# Patient Record
Sex: Female | Born: 1980 | Race: White | Hispanic: No | Marital: Married | Smoking: Never smoker
Health system: Southern US, Community
[De-identification: ages and names within clinical notes are randomized; demographics above are authoritative.]

## PROBLEM LIST (undated history)

## (undated) ENCOUNTER — Inpatient Hospital Stay (HOSPITAL_COMMUNITY): Payer: Self-pay

## (undated) DIAGNOSIS — E282 Polycystic ovarian syndrome: Secondary | ICD-10-CM

## (undated) DIAGNOSIS — N979 Female infertility, unspecified: Secondary | ICD-10-CM

## (undated) DIAGNOSIS — E559 Vitamin D deficiency, unspecified: Secondary | ICD-10-CM

## (undated) DIAGNOSIS — Z789 Other specified health status: Secondary | ICD-10-CM

## (undated) DIAGNOSIS — I10 Essential (primary) hypertension: Secondary | ICD-10-CM

## (undated) HISTORY — DX: Essential (primary) hypertension: I10

## (undated) HISTORY — DX: Vitamin D deficiency, unspecified: E55.9

## (undated) HISTORY — DX: Female infertility, unspecified: N97.9

## (undated) HISTORY — DX: Other specified health status: Z78.9

## (undated) HISTORY — PX: WISDOM TOOTH EXTRACTION: SHX21

## (undated) HISTORY — DX: Polycystic ovarian syndrome: E28.2

---

## 2012-02-04 ENCOUNTER — Emergency Department (INDEPENDENT_AMBULATORY_CARE_PROVIDER_SITE_OTHER)
Admission: EM | Admit: 2012-02-04 | Discharge: 2012-02-04 | Disposition: A | Discharge status: Home / Self Care | Payer: Commercial Managed Care - PPO | Source: Home / Self Care | Attending: Emergency Medicine | Admitting: Emergency Medicine

## 2012-02-04 ENCOUNTER — Encounter: Payer: Self-pay | Admitting: *Deleted

## 2012-02-04 DIAGNOSIS — R300 Dysuria: Secondary | ICD-10-CM

## 2012-02-04 DIAGNOSIS — N390 Urinary tract infection, site not specified: Secondary | ICD-10-CM

## 2012-02-04 LAB — POCT URINALYSIS DIP (MANUAL ENTRY)
Protein Ur, POC: 30
Spec Grav, UA: >=1.03 (ref 1.005–1.03)
Urobilinogen, UA: 1 (ref 0–1)

## 2012-02-04 MED ORDER — CIPROFLOXACIN HCL 500 MG PO TABS: 500.0000 mg | ORAL_TABLET | Freq: Two times a day (BID) | ORAL | Status: DC

## 2012-02-04 NOTE — ED Provider Notes (Signed)
History     CSN: [id number]  Arrival date & time 02/04/12  1816   First MD Initiated Contact with Patient 02/04/12 1822      Chief Complaint  Patient presents with  . Dysuria  . Urinary Frequency    (Consider location/radiation/quality/duration/timing/severity/associated sxs/prior treatment) HPI Adrienne Bishop is a 32 y.o. female who presents today with UTI symptoms for 3 weeks.  Sexually active with her boyfriend, no history of STD. + dysuria with odor in the mornings + frequency + urgency No hematuria No vaginal discharge No fever/chills No lower abdominal pain No back pain No fatigue    History reviewed. No pertinent past medical history.  Past Surgical History  Procedure Date  . Wisdom tooth extraction     History reviewed. No pertinent family history.  History  Substance Use Topics  . Smoking status: Never Smoker   . Smokeless tobacco: Not on file  . Alcohol Use: No    OB History    Grav Para Term Preterm Abortions TAB SAB Ect Mult Living                  Review of Systems  All other systems reviewed and are negative.    Allergies  Review of patient's allergies indicates no known allergies.  Home Medications   Current Outpatient Rx  Name  Route  Sig  Dispense  Refill  . DESOGESTREL-ETHINYL ESTRADIOL 0.15-0.02/0.01 MG (21/5) PO TABS   Oral   Take 1 tablet by mouth daily.           BP 144/90  Pulse 78  Temp 98.4 F (36.9 C) (Oral)  Resp 18  Ht 5\' 3"  (1.6 m)  Wt 235 lb (106.595 kg)  BMI 41.63 kg/m2  SpO2 100%  LMP 01/23/2012  Physical Exam  Nursing note and vitals reviewed. Constitutional: She is oriented to person, place, and time. She appears well-developed and well-nourished.  HENT:  Head: Normocephalic and atraumatic.  Eyes: No scleral icterus.  Neck: Neck supple.  Cardiovascular: Regular rhythm and normal heart sounds.   Pulmonary/Chest: Effort normal and breath sounds normal. No respiratory distress.  Abdominal: Soft. Normal  appearance and bowel sounds are normal. She exhibits no mass. There is no rebound, no guarding and no CVA tenderness.  Neurological: She is alert and oriented to person, place, and time.  Skin: Skin is warm and dry.  Psychiatric: She has a normal mood and affect. Her speech is normal.    ED Course  Procedures (including critical care time)   Labs Reviewed  POCT URINALYSIS DIP (MANUAL ENTRY)  URINE CULTURE   No results found.   1. Urinary tract infection, site not specified   2. Dysuria    Results for orders placed during the hospital encounter of 02/04/12  POCT URINALYSIS DIP (MANUAL ENTRY)      Component Value Range   Color, UA yellow     Clarity, UA cloudy     Glucose, UA neg     Bilirubin, UA negative     Bilirubin, UA negative     Spec Grav, UA >=1.030  1.005 - 1.03   Blood, UA small     pH, UA 6.0  5 - 8   Protein Ur, POC =30     Urobilinogen, UA 1.0  0 - 1   Nitrite, UA Negative     Leukocytes, UA Trace         MDM  1) Take the prescribed antibiotic as directed. 2) A urinalysis was  done in clinic.  A urine culture is pending. 3) Follow up with your PCP or urologist if not improving or if worsening symptoms.  If not improving after Cipro, would suggest f/u with her gynecologist to check on other etiologies such as BV, yeast, etc.   Marlaine Hind, MD 02/04/12 1840

## 2012-02-04 NOTE — ED Notes (Signed)
Pt c/o urinary urgency, foul smelling urine and dysuria x 3wks. Denies fever. No OTC meds.

## 2012-02-09 ENCOUNTER — Telehealth: Payer: Self-pay | Admitting: *Deleted

## 2015-03-03 ENCOUNTER — Emergency Department (INDEPENDENT_AMBULATORY_CARE_PROVIDER_SITE_OTHER): Payer: Self-pay

## 2015-03-03 ENCOUNTER — Emergency Department (INDEPENDENT_AMBULATORY_CARE_PROVIDER_SITE_OTHER)
Admission: EM | Admit: 2015-03-03 | Discharge: 2015-03-03 | Disposition: A | Discharge status: Home / Self Care | Payer: Self-pay | Source: Home / Self Care | Attending: Emergency Medicine | Admitting: Emergency Medicine

## 2015-03-03 ENCOUNTER — Encounter (HOSPITAL_COMMUNITY): Payer: Self-pay | Admitting: Emergency Medicine

## 2015-03-03 DIAGNOSIS — J189 Pneumonia, unspecified organism: Secondary | ICD-10-CM

## 2015-03-03 MED ORDER — LIDOCAINE HCL (PF) 1 % IJ SOLN
INTRAMUSCULAR | Status: AC
  Filled 2015-03-03: qty 5

## 2015-03-03 MED ORDER — AZITHROMYCIN 250 MG PO TABS: 250.0000 mg | ORAL_TABLET | Freq: Every day | ORAL | Status: DC

## 2015-03-03 MED ORDER — CEFTRIAXONE SODIUM 1 G IJ SOLR
INTRAMUSCULAR | Status: AC
  Filled 2015-03-03: qty 10

## 2015-03-03 MED ORDER — CEFTRIAXONE SODIUM 1 G IJ SOLR
1.0000 g | Freq: Once | INTRAMUSCULAR | Status: AC
  Administered 2015-03-03: 1 g via INTRAMUSCULAR

## 2015-03-03 MED ORDER — AZITHROMYCIN 250 MG PO TABS
ORAL_TABLET | ORAL | Status: AC
  Filled 2015-03-03: qty 2

## 2015-03-03 MED ORDER — AZITHROMYCIN 250 MG PO TABS
500.0000 mg | ORAL_TABLET | Freq: Once | ORAL | Status: AC
  Administered 2015-03-03: 500 mg via ORAL

## 2015-03-03 NOTE — ED Notes (Signed)
Patient placed on 20 minute observation post IM injection.

## 2015-03-03 NOTE — ED Provider Notes (Signed)
CSN: [id number]     Arrival date & time 03/03/15  1639 History   First MD Initiated Contact with Patient 03/03/15 1721     Chief Complaint  Patient presents with  . Fever  . Cough  . Nausea   (Consider location/radiation/quality/duration/timing/severity/associated sxs/prior Treatment) Patient is a 35 y.o. female presenting with fever and cough. The history is provided by the patient. No language interpreter was used.  Fever Max temp prior to arrival:  102 Temp source:  Subjective and oral Severity:  Moderate Onset quality:  Gradual Duration:  3 days Timing:  Constant Progression:  Worsening Chronicity:  New Relieved by:  Nothing Worsened by:  Nothing tried Ineffective treatments:  None tried Associated symptoms: cough   Risk factors: no sick contacts   Cough Associated symptoms: fever   Pt complains of a cough and fever  History reviewed. No pertinent past medical history. Past Surgical History  Procedure Laterality Date  . Wisdom tooth extraction     History reviewed. No pertinent family history. Social History  Substance Use Topics  . Smoking status: Never Smoker   . Smokeless tobacco: None  . Alcohol Use: No   OB History    No data available     Review of Systems  Constitutional: Positive for fever.  Respiratory: Positive for cough.   All other systems reviewed and are negative.   Allergies  Review of patient's allergies indicates no known allergies.  Home Medications   Prior to Admission medications   Medication Sig Start Date End Date Taking? Authorizing Provider  ciprofloxacin (CIPRO) 500 MG tablet Take 1 tablet (500 mg total) by mouth 2 (two) times daily. 02/04/12   Marlaine Hind, MD  desogestrel-ethinyl estradiol (AZURETTE) 0.15-0.02/0.01 MG (21/5) tablet Take 1 tablet by mouth daily.    Historical Provider, MD   Meds Ordered and Administered this Visit  Medications - No data to display  BP 159/85 mmHg  Pulse 111  Temp(Src) 102.6 F (39.2  C) (Oral)  SpO2 98%  LMP 03/03/2015 (Exact Date) No data found.   Physical Exam  Constitutional: She is oriented to person, place, and time. She appears well-developed and well-nourished.  HENT:  Head: Normocephalic and atraumatic.  Eyes: EOM are normal. Pupils are equal, round, and reactive to light.  Neck: Normal range of motion.  Cardiovascular: Normal rate and normal heart sounds.   Pulmonary/Chest: Effort normal.  Rhonchi left lung  Abdominal: She exhibits no distension.  Musculoskeletal: Normal range of motion.  Neurological: She is alert and oriented to person, place, and time.  Skin: Skin is warm.  Psychiatric: She has a normal mood and affect.  Nursing note and vitals reviewed.   ED Course  Procedures (including critical care time)  Labs Review Labs Reviewed - No data to display  Imaging Review Dg Chest 2 View  03/03/2015  CLINICAL DATA:  Two day history of fever, cough and nausea. EXAM: CHEST  2 VIEW COMPARISON:  None. FINDINGS: Cardiomediastinal silhouette unremarkable. Airspace consolidation in the superior segment left lower lobe. Lungs otherwise clear. No pleural effusions. Visualized bony thorax intact. IMPRESSION: Acute pneumonia involving the superior segment left lower lobe. Electronically Signed   By: Hulan Saas M.D.   On: 03/03/2015 18:32     Visual Acuity Review  Right Eye Distance:   Left Eye Distance:   Bilateral Distance:    Right Eye Near:   Left Eye Near:    Bilateral Near:         MDM  Pt counseled on pneumonia. Pt advised to see her MD for recheck in 2-3 days.  Go to ED if symptoms worsen or cahnge    1. Community acquired pneumonia    Meds ordered this encounter  Medications  . cefTRIAXone (ROCEPHIN) injection 1 g    Sig:     Order Specific Question:  Antibiotic Indication:    Answer:  CAP  . azithromycin (ZITHROMAX) tablet 500 mg    Sig:   . DISCONTD: azithromycin (ZITHROMAX) 250 MG tablet    Sig: Take 1 tablet (250  mg total) by mouth daily. Take first 2 tablets together, then 1 every day until finished.    Dispense:  6 tablet    Refill:  0    Order Specific Question:  Supervising Provider    Answer:  Charm Rings [id number]  . azithromycin (ZITHROMAX) 250 MG tablet    Sig: Take 1 tablet (250 mg total) by mouth daily. Take first 2 tablets together, then 1 every day until finished.    Dispense:  6 tablet    Refill:  0    Order Specific Question:  Supervising Provider    Answer:  Charm Rings [phone]  An After Visit Summary was printed and given to the patient.    Lonia Skinner [place], PA-C 03/03/15 1930

## 2015-03-03 NOTE — ED Notes (Signed)
Patient completed 20 minute observation with no signs or complaints of reaction.

## 2015-03-03 NOTE — Discharge Instructions (Signed)

## 2015-03-03 NOTE — ED Notes (Signed)
The patient presented to the [facility] with a complaint of a fever, cough and nausea x 2 days.

## 2015-03-12 ENCOUNTER — Encounter: Payer: Self-pay | Admitting: *Deleted

## 2015-03-12 ENCOUNTER — Emergency Department
Admission: EM | Admit: 2015-03-12 | Discharge: 2015-03-12 | Disposition: A | Discharge status: Home / Self Care | Payer: Self-pay | Source: Home / Self Care | Attending: Family Medicine | Admitting: Family Medicine

## 2015-03-12 DIAGNOSIS — R05 Cough: Secondary | ICD-10-CM

## 2015-03-12 DIAGNOSIS — R0989 Other specified symptoms and signs involving the circulatory and respiratory systems: Secondary | ICD-10-CM

## 2015-03-12 DIAGNOSIS — R059 Cough, unspecified: Secondary | ICD-10-CM

## 2015-03-12 DIAGNOSIS — J189 Pneumonia, unspecified organism: Secondary | ICD-10-CM

## 2015-03-12 MED ORDER — ALBUTEROL SULFATE HFA 108 (90 BASE) MCG/ACT IN AERS: 1.0000 | INHALATION_SPRAY | Freq: Four times a day (QID) | RESPIRATORY_TRACT | Status: DC | PRN

## 2015-03-12 MED ORDER — BENZONATATE 100 MG PO CAPS: 100.0000 mg | ORAL_CAPSULE | Freq: Three times a day (TID) | ORAL | Status: DC

## 2015-03-12 MED ORDER — DM-GUAIFENESIN ER 30-600 MG PO TB12: 1.0000 | ORAL_TABLET | Freq: Two times a day (BID) | ORAL | Status: DC

## 2015-03-12 MED ORDER — PREDNISONE 20 MG PO TABS: ORAL_TABLET | ORAL | Status: DC

## 2015-03-12 NOTE — ED Notes (Signed)
Pt reports that she still has some SOB, a nonproductive cough and fatigue after her dx of pneumonia on 03/03/15. Denies fever.

## 2015-03-12 NOTE — ED Provider Notes (Signed)
CSN: [id number]     Arrival date & time 03/12/15  1645 History   First MD Initiated Contact with Patient 03/12/15 1654     Chief Complaint  Patient presents with  . Cough   (Consider location/radiation/quality/duration/timing/severity/associated sxs/prior Treatment) HPI Pt is a 34yo female presenting to [facility] with c/o continued cough, mild intermittent shortness of breath and fatigue that started initially about 10 days ago, dx with CAP on 03/03/15.  She was given IM rocephin once and discharged home with azithromycin.  She completed her antibiotic about 4 days ago but states she still has a mild to moderately intermittent productive cough. Pt states it feels like there is congestion stuck in her upper chest and throat that she cannot get up.  She has been taking Nyquil to help her sleep but no cough medication during the day. Pt notes the fever, nausea and vomiting resolved once she completed the antibiotics. Denies hx of asthma. Denies prior need for inhaler when sick. No recent travel.   History reviewed. No pertinent past medical history. Past Surgical History  Procedure Laterality Date  . Wisdom tooth extraction     History reviewed. No pertinent family history. Social History  Substance Use Topics  . Smoking status: Never Smoker   . Smokeless tobacco: None  . Alcohol Use: No   OB History    No data available     Review of Systems  Constitutional: Negative for fever and chills.  HENT: Positive for congestion, rhinorrhea, sneezing, sore throat and voice change. Negative for ear pain and trouble swallowing.   Respiratory: Positive for cough, shortness of breath and wheezing.   Cardiovascular: Negative for chest pain and palpitations.  Gastrointestinal: Negative for nausea, vomiting, abdominal pain and diarrhea.  Musculoskeletal: Positive for myalgias and arthralgias. Negative for back pain.  Skin: Negative for rash.  All other systems reviewed and are negative.   Allergies   Review of patient's allergies indicates no known allergies.  Home Medications   Prior to Admission medications   Medication Sig Start Date End Date Taking? Authorizing Provider  albuterol (PROVENTIL HFA;VENTOLIN HFA) 108 (90 Base) MCG/ACT inhaler Inhale 1-2 puffs into the lungs every 6 (six) hours as needed for wheezing or shortness of breath. 03/12/15   Junius Finner, PA-C  benzonatate (TESSALON) 100 MG capsule Take 1 capsule (100 mg total) by mouth every 8 (eight) hours. 03/12/15   Junius Finner, PA-C  desogestrel-ethinyl estradiol (AZURETTE) 0.15-0.02/0.01 MG (21/5) tablet Take 1 tablet by mouth daily.    Historical Provider, MD  dextromethorphan-guaiFENesin (MUCINEX DM) 30-600 MG 12hr tablet Take 1 tablet by mouth 2 (two) times daily. Take with large glass of water 03/12/15   Junius Finner, PA-C  predniSONE (DELTASONE) 20 MG tablet 3 tabs po day one, then 2 po daily x 4 days 03/12/15   Junius Finner, PA-C   Meds Ordered and Administered this Visit  Medications - No data to display  BP 142/87 mmHg  Pulse 87  Temp(Src) 98.1 F (36.7 C) (Oral)  Resp 18  Ht  (1.6 m)  Wt 263 lb (119.296 kg)  BMI 46.60 kg/m2  SpO2 99%  LMP 03/03/2015 (Exact Date) No data found.   Physical Exam  Constitutional: She appears well-developed and well-nourished. No distress.  HENT:  Head: Normocephalic and atraumatic.  Right Ear: Hearing, tympanic membrane, external ear and ear canal normal.  Left Ear: Hearing, tympanic membrane, external ear and ear canal normal.  Nose: Mucosal edema and rhinorrhea present. Right sinus exhibits  no maxillary sinus tenderness and no frontal sinus tenderness. Left sinus exhibits no maxillary sinus tenderness and no frontal sinus tenderness.  Mouth/Throat: Uvula is midline, oropharynx is clear and moist and mucous membranes are normal.  Eyes: Conjunctivae are normal. No scleral icterus.  Neck: Normal range of motion. Neck supple.  Cardiovascular: Normal rate, regular rhythm  and normal heart sounds.   Pulmonary/Chest: Effort normal and breath sounds normal. No stridor. No respiratory distress. She has no wheezes. She has no rales. She exhibits no tenderness.  Abdominal: Soft. She exhibits no distension. There is no tenderness.  Musculoskeletal: Normal range of motion.  Lymphadenopathy:    She has no cervical adenopathy.  Neurological: She is alert.  Skin: Skin is warm and dry. She is not diaphoretic.  Nursing note and vitals reviewed.   ED Course  Procedures (including critical care time)  Labs Review Labs Reviewed - No data to display  Imaging Review No results found.    MDM   1. CAP (community acquired pneumonia)   2. Cough   3. Chest congestion    Pt c/o continued cough and fatigue after recent dx of CAP. Pt appears well, non-toxic. Lungs: CTAB.  O2 Sat 99% on RA  Discussed getting a repeat CXR to ensure pneumonia is not worsening, however, with no fever or vomiting and some improvement shown after antibiotic treatment, low concern for worsening of illness at this time.  Pt agreed to to hold off on CXR and try more symptomatic treatment.  Rx: Albuterol inhaler, prednisone, tessalon, and Mucinex.  Advised pt to use acetaminophen and ibuprofen as needed for fever and pain. Encouraged rest and fluids. F/u with PCP in 5-7 days if not improving, sooner if worsening. Pt verbalized understanding and agreement with tx plan.    Junius Finner, PA-C 03/12/15 [phone]

## 2015-03-28 ENCOUNTER — Other Ambulatory Visit: Payer: Self-pay | Admitting: Physician Assistant

## 2015-03-28 ENCOUNTER — Ambulatory Visit
Admission: RE | Admit: 2015-03-28 | Discharge: 2015-03-28 | Disposition: A | Discharge status: Home / Self Care | Payer: Self-pay | Source: Ambulatory Visit | Attending: Physician Assistant | Admitting: Physician Assistant

## 2015-03-28 DIAGNOSIS — Z8701 Personal history of pneumonia (recurrent): Secondary | ICD-10-CM

## 2015-12-19 ENCOUNTER — Encounter: Payer: Self-pay | Admitting: Podiatry

## 2015-12-19 ENCOUNTER — Ambulatory Visit (INDEPENDENT_AMBULATORY_CARE_PROVIDER_SITE_OTHER): Payer: 59 | Admitting: Podiatry

## 2015-12-19 VITALS — BP 124/89 | HR 89

## 2015-12-19 DIAGNOSIS — L600 Ingrowing nail: Secondary | ICD-10-CM

## 2015-12-19 NOTE — Progress Notes (Signed)
   Subjective:    Patient ID: Tresa EndoKelly Tal, female    DOB: 03/05/1980, [age] y.o.   MRN: [id number]  HPI    Review of Systems  Allergic/Immunologic: Positive for food allergies.  All other systems reviewed and are negative.      Objective:   Physical Exam        Assessment & Plan:

## 2015-12-19 NOTE — Progress Notes (Signed)
Subjective:     Patient ID: Adrienne Bishop, female   DOB: 06/27/1980, 35 y.o.   MRN: [id number]  HPI patient states she has approximate 6 month history of painful ingrown toenail left big toe. She had some drainage earlier and that has resolved but she continues to develop ingrown toenail with pain on the medial side of the big toe   Review of Systems  All other systems reviewed and are negative.      Objective:   Physical Exam  Constitutional: She is oriented to person, place, and time.  Cardiovascular: Intact distal pulses.   Musculoskeletal: Normal range of motion.  Neurological: She is oriented to person, place, and time.  Skin: Skin is warm.  Nursing note and vitals reviewed.  neurovascular status intact muscle strength adequate range of motion within normal limits with patient found to have incurvated left hallux medial border with pain when palpated and slight distal redness with no active drainage. Found to have good digital perfusion and is well oriented 3     Assessment:     Ingrown toenail deformity left hallux medial border with pain    Plan:     H&P condition reviewed and discussed permanent procedure with risk of procedure and risk of nonhealing. Patient wants surgery understanding risk and today I infiltrated the left hallux 60 Milligan times I can Marcaine mixture removed the medial border exposed matrix and applied phenol 3 applications 30 seconds followed by alcohol lavage and sterile dressing. Gave instructions on soaks and reappoint

## 2015-12-19 NOTE — Patient Instructions (Signed)

## 2016-04-17 DIAGNOSIS — Z0000 Encounter for general adult medical examination without abnormal findings: Secondary | ICD-10-CM | POA: Diagnosis not present

## 2016-04-17 DIAGNOSIS — Z713 Dietary counseling and surveillance: Secondary | ICD-10-CM | POA: Diagnosis not present

## 2016-06-16 DIAGNOSIS — E559 Vitamin D deficiency, unspecified: Secondary | ICD-10-CM | POA: Diagnosis not present

## 2016-08-20 DIAGNOSIS — N926 Irregular menstruation, unspecified: Secondary | ICD-10-CM | POA: Diagnosis not present

## 2016-09-30 DIAGNOSIS — Z01419 Encounter for gynecological examination (general) (routine) without abnormal findings: Secondary | ICD-10-CM | POA: Diagnosis not present

## 2016-11-30 DIAGNOSIS — J3089 Other allergic rhinitis: Secondary | ICD-10-CM | POA: Diagnosis not present

## 2016-11-30 DIAGNOSIS — J301 Allergic rhinitis due to pollen: Secondary | ICD-10-CM | POA: Diagnosis not present

## 2017-01-18 DIAGNOSIS — S29019A Strain of muscle and tendon of unspecified wall of thorax, initial encounter: Secondary | ICD-10-CM | POA: Diagnosis not present

## 2017-02-23 DIAGNOSIS — R05 Cough: Secondary | ICD-10-CM | POA: Diagnosis not present

## 2017-07-06 DIAGNOSIS — E288 Other ovarian dysfunction: Secondary | ICD-10-CM | POA: Diagnosis not present

## 2017-07-06 DIAGNOSIS — Z319 Encounter for procreative management, unspecified: Secondary | ICD-10-CM | POA: Diagnosis not present

## 2017-07-06 DIAGNOSIS — E282 Polycystic ovarian syndrome: Secondary | ICD-10-CM | POA: Diagnosis not present

## 2017-09-01 DIAGNOSIS — E288 Other ovarian dysfunction: Secondary | ICD-10-CM | POA: Diagnosis not present

## 2017-09-01 DIAGNOSIS — Z319 Encounter for procreative management, unspecified: Secondary | ICD-10-CM | POA: Diagnosis not present

## 2017-09-17 DIAGNOSIS — Z319 Encounter for procreative management, unspecified: Secondary | ICD-10-CM | POA: Diagnosis not present

## 2017-09-17 DIAGNOSIS — E288 Other ovarian dysfunction: Secondary | ICD-10-CM | POA: Diagnosis not present

## 2017-10-25 DIAGNOSIS — Z3200 Encounter for pregnancy test, result unknown: Secondary | ICD-10-CM | POA: Diagnosis not present

## 2017-10-27 DIAGNOSIS — Z3201 Encounter for pregnancy test, result positive: Secondary | ICD-10-CM | POA: Diagnosis not present

## 2017-11-10 DIAGNOSIS — Z3200 Encounter for pregnancy test, result unknown: Secondary | ICD-10-CM | POA: Diagnosis not present

## 2017-11-17 DIAGNOSIS — O200 Threatened abortion: Secondary | ICD-10-CM | POA: Diagnosis not present

## 2017-11-24 DIAGNOSIS — O0900 Supervision of pregnancy with history of infertility, unspecified trimester: Secondary | ICD-10-CM | POA: Diagnosis not present

## 2017-12-15 LAB — OB RESULTS CONSOLE ANTIBODY SCREEN: Antibody Screen: NEGATIVE

## 2017-12-15 LAB — OB RESULTS CONSOLE HIV ANTIBODY (ROUTINE TESTING): HIV: NONREACTIVE

## 2017-12-15 LAB — OB RESULTS CONSOLE ABO/RH: RH Type: POSITIVE

## 2017-12-15 LAB — OB RESULTS CONSOLE GC/CHLAMYDIA
Chlamydia: NEGATIVE
Gonorrhea: NEGATIVE

## 2017-12-15 LAB — OB RESULTS CONSOLE RPR: RPR: NONREACTIVE

## 2017-12-15 LAB — OB RESULTS CONSOLE HEPATITIS B SURFACE ANTIGEN: Hepatitis B Surface Ag: NEGATIVE

## 2017-12-15 LAB — OB RESULTS CONSOLE RUBELLA ANTIBODY, IGM: Rubella: IMMUNE

## 2017-12-22 ENCOUNTER — Inpatient Hospital Stay (HOSPITAL_COMMUNITY)
Admission: AD | Admit: 2017-12-22 | Discharge: 2017-12-23 | Disposition: A | Discharge status: Home / Self Care | Payer: Commercial Managed Care - PPO | Source: Ambulatory Visit | Attending: Obstetrics and Gynecology | Admitting: Obstetrics and Gynecology

## 2017-12-22 ENCOUNTER — Other Ambulatory Visit: Payer: Self-pay

## 2017-12-22 ENCOUNTER — Encounter (HOSPITAL_COMMUNITY): Payer: Self-pay | Admitting: *Deleted

## 2017-12-22 DIAGNOSIS — E860 Dehydration: Secondary | ICD-10-CM | POA: Diagnosis not present

## 2017-12-22 DIAGNOSIS — O210 Mild hyperemesis gravidarum: Secondary | ICD-10-CM | POA: Insufficient documentation

## 2017-12-22 DIAGNOSIS — O219 Vomiting of pregnancy, unspecified: Secondary | ICD-10-CM | POA: Diagnosis not present

## 2017-12-22 DIAGNOSIS — Z3A12 12 weeks gestation of pregnancy: Secondary | ICD-10-CM | POA: Insufficient documentation

## 2017-12-22 LAB — URINALYSIS, DIPSTICK ONLY
GLUCOSE, UA: NEGATIVE mg/dL
Hgb urine dipstick: NEGATIVE
Ketones, ur: >80 mg/dL — AB
NITRITE: NEGATIVE
PROTEIN: 30 mg/dL — AB
Specific Gravity, Urine: >1.03 — ABNORMAL HIGH (ref 1.005–1.030)
pH: 6 (ref 5.0–8.0)

## 2017-12-22 LAB — POCT PREGNANCY, URINE: PREG TEST UR: POSITIVE — AB

## 2017-12-22 MED ORDER — PROMETHAZINE HCL 25 MG/ML IJ SOLN
25.0000 mg | Freq: Once | INTRAMUSCULAR | Status: AC
  Administered 2017-12-22: 25 mg via INTRAVENOUS
  Filled 2017-12-22: qty 1

## 2017-12-22 MED ORDER — M.V.I. ADULT IV INJ
Freq: Once | INTRAVENOUS | Status: AC
  Administered 2017-12-22: 23:00:00 via INTRAVENOUS
  Filled 2017-12-22: qty 10

## 2017-12-22 NOTE — MAU Note (Signed)
Presents with c/o N&V x3 days, unable to keep anything down.  Reports has vomited 8x in past 24 hours.  Denies VB or abdominal pain. Reports currently has UTI, taking Bactrim, started yesterday.

## 2017-12-22 NOTE — MAU Provider Note (Signed)
Chief Complaint: Emesis and Nausea   First Provider Initiated Contact with Patient 12/22/17 2049        SUBJECTIVE HPI: Adrienne Bishop is a 37 y.o. G1P0 at [redacted]w[redacted]d by LMP who presents to maternity admissions reporting nausea and vomiting for 3 days, unable to keep anything down.  Denies fever or diarrhea. She denies vaginal bleeding, vaginal itching/burning, urinary symptoms, h/a, dizziness, or fever/chills.    Emesis   This is a new problem. The current episode started in the past 7 days. The problem has been unchanged. There has been no fever. Associated symptoms include weight loss. Pertinent negatives include no abdominal pain, chest pain, chills, diarrhea, dizziness, fever, headaches or myalgias. She has tried nothing for the symptoms.   RN Note: Presents with c/o N&V x3 days, unable to keep anything down.  Reports has vomited 8x in past 24 hours.  Denies VB or abdominal pain. Reports currently has UTI, taking Bactrim, started yesterday  History reviewed. No pertinent past medical history. Past Surgical History:  Procedure Laterality Date  . WISDOM TOOTH EXTRACTION     Social History   Socioeconomic History  . Marital status: Married    Spouse name: Not on file  . Number of children: Not on file  . Years of education: Not on file  . Highest education level: Not on file  Occupational History  . Not on file  Social Needs  . Financial resource strain: Not on file  . Food insecurity:    Worry: Not on file    Inability: Not on file  . Transportation needs:    Medical: Not on file    Non-medical: Not on file  Tobacco Use  . Smoking status: Never Smoker  . Smokeless tobacco: Never Used  Substance and Sexual Activity  . Alcohol use: No  . Drug use: No  . Sexual activity: Yes  Lifestyle  . Physical activity:    Days per week: Not on file    Minutes per session: Not on file  . Stress: Not on file  Relationships  . Social connections:    Talks on phone: Not on file    Gets  together: Not on file    Attends religious service: Not on file    Active member of club or organization: Not on file    Attends meetings of clubs or organizations: Not on file    Relationship status: Not on file  . Intimate partner violence:    Fear of current or ex partner: Not on file    Emotionally abused: Not on file    Physically abused: Not on file    Forced sexual activity: Not on file  Other Topics Concern  . Not on file  Social History Narrative  . Not on file   No current facility-administered medications on file prior to encounter.    No current outpatient medications on file prior to encounter.   Allergies  Allergen Reactions  . Tree Extract Anaphylaxis    Tree nuts    I have reviewed patient's Past Medical Hx, Surgical Hx, Family Hx, Social Hx, medications and allergies.   ROS:  Review of Systems  Constitutional: Positive for weight loss. Negative for chills and fever.  Cardiovascular: Negative for chest pain.  Gastrointestinal: Positive for vomiting. Negative for abdominal pain and diarrhea.  Musculoskeletal: Negative for myalgias.  Neurological: Negative for dizziness and headaches.   Review of Systems  Other systems negative   Physical Exam  Physical Exam Patient Vitals for  the past 24 hrs:  BP Temp Temp src Pulse Resp SpO2 Height Weight  12/23/17 0018 140/69 - - 87 - - - -  12/22/17 2110 (!) 148/89 - - 86 - - - -  12/22/17 1901 (!) 142/100 97.9 F (36.6 C) Oral 94 20 100 % 5\' 3"  (1.6 m) 110.9 kg   Constitutional: Well-developed, well-nourished female in no acute distress.  Cardiovascular: normal rate Respiratory: normal effort GI: Abd soft, non-tender. Pos BS x 4 MS: Extremities nontender, no edema, normal ROM Neurologic: Alert and oriented x 4.  GU: Neg CVAT.  PELVIC EXAM: deferred  FHT 150s by bedside US  LAB RESULTS Results for orders placed or performed during the hospital encounter of 12/22/17 (from the past 24 hour(s))  Urinalysis,  dipstick only     Status: Abnormal   Collection Time: 12/22/17  7:21 PM  Result Value Ref Range   Color, Urine YELLOW YELLOW   APPearance HAZY (A) CLEAR   Specific Gravity, Urine >1.030 (H) 1.005 - 1.030   pH 6.0 5.0 - 8.0   Glucose, UA NEGATIVE NEGATIVE mg/dL   Hgb urine dipstick NEGATIVE NEGATIVE   Bilirubin Urine SMALL (A) NEGATIVE   Ketones, ur >80 (A) NEGATIVE mg/dL   Protein, ur 30 (A) NEGATIVE mg/dL   Nitrite NEGATIVE NEGATIVE   Leukocytes, UA TRACE (A) NEGATIVE  Pregnancy, urine POC     Status: Abnormal   Collection Time: 12/22/17  7:36 PM  Result Value Ref Range   Preg Test, Ur POSITIVE (A) NEGATIVE      IMAGING No results found.  MAU Management/MDM: Patient hydrated with IV fluids First bag had Phenergan added to it Second had an amp of MVI.  She did get a few minutes of flushing, presumably from Niacin, which resolved After second bag, she ate a bag of Doritos and kept them down. Given ginger ale by us.   ASSESSMENT No diagnosis found.  PLAN Discharge home  Advance diet as tolerated Rx Diclegis for prn use at home  Pt stable at time of discharge. Encouraged to return here or to other Urgent Care/ED if she develops worsening of symptoms, increase in pain, fever, or other concerning symptoms.    Wynelle BourgeoisMarie Terre Bishop CNM, MSN Certified Nurse-Midwife 12/23/2017  1:03 AM

## 2017-12-23 DIAGNOSIS — O210 Mild hyperemesis gravidarum: Secondary | ICD-10-CM | POA: Diagnosis not present

## 2017-12-23 DIAGNOSIS — Z3A12 12 weeks gestation of pregnancy: Secondary | ICD-10-CM | POA: Diagnosis not present

## 2017-12-23 MED ORDER — DOXYLAMINE-PYRIDOXINE 10-10 MG PO TBEC: 2.0000 | DELAYED_RELEASE_TABLET | Freq: Every evening | ORAL | 5 refills | Status: DC | PRN

## 2017-12-23 NOTE — Discharge Instructions (Signed)
Morning Sickness °Morning sickness is when you feel sick to your stomach (nauseous) during pregnancy. This nauseous feeling may or may not come with vomiting. It often occurs in the morning but can be a problem any time of day. Morning sickness is most common during the first trimester, but it may continue throughout pregnancy. While morning sickness is unpleasant, it is usually harmless unless you develop severe and continual vomiting (hyperemesis gravidarum). This condition requires more intense treatment. °What are the causes? °The cause of morning sickness is not completely known but seems to be related to normal hormonal changes that occur in pregnancy. °What increases the risk? °You are at greater risk if you: °· Experienced nausea or vomiting before your pregnancy. °· Had morning sickness during a previous pregnancy. °· Are pregnant with more than one baby, such as twins. ° °How is this treated? °Do not use any medicines (prescription, over-the-counter, or herbal) for morning sickness without first talking to your health care provider. Your health care provider may prescribe or recommend: °· Vitamin B6 supplements. °· Anti-nausea medicines. °· The herbal medicine ginger. ° °Follow these instructions at home: °· Only take over-the-counter or prescription medicines as directed by your health care provider. °· Taking multivitamins before getting pregnant can prevent or decrease the severity of morning sickness in most women. °· Eat a piece of dry toast or unsalted crackers before getting out of bed in the morning. °· Eat five or six small meals a day. °· Eat dry and bland foods (rice, baked potato). Foods high in carbohydrates are often helpful. °· Do not drink liquids with your meals. Drink liquids between meals. °· Avoid greasy, fatty, and spicy foods. °· Get someone to cook for you if the smell of any food causes nausea and vomiting. °· If you feel nauseous after taking prenatal vitamins, take the vitamins at  night or with a snack. °· Snack on protein foods (nuts, yogurt, cheese) between meals if you are hungry. °· Eat unsweetened gelatins for desserts. °· Wearing an acupressure wristband (worn for sea sickness) may be helpful. °· Acupuncture may be helpful. °· Do not smoke. °· Get a humidifier to keep the air in your house free of odors. °· Get plenty of fresh air. °Contact a health care provider if: °· Your home remedies are not working, and you need medicine. °· You feel dizzy or lightheaded. °· You are losing weight. °Get help right away if: °· You have persistent and uncontrolled nausea and vomiting. °· You pass out (faint). °This information is not intended to replace advice given to you by your health care provider. Make sure you discuss any questions you have with your health care provider. °Document Released: 03/12/2006 Document Revised: 06/27/2015 Document Reviewed: 07/06/2012 °[employer]vier Interactive Patient Education © 2017 [employer] ° °

## 2017-12-27 DIAGNOSIS — O09512 Supervision of elderly primigravida, second trimester: Secondary | ICD-10-CM | POA: Diagnosis not present

## 2017-12-27 DIAGNOSIS — Z3A13 13 weeks gestation of pregnancy: Secondary | ICD-10-CM | POA: Diagnosis not present

## 2018-01-13 DIAGNOSIS — M545 Low back pain: Secondary | ICD-10-CM | POA: Diagnosis not present

## 2018-02-10 DIAGNOSIS — Z3A19 19 weeks gestation of pregnancy: Secondary | ICD-10-CM | POA: Diagnosis not present

## 2018-02-10 DIAGNOSIS — O09512 Supervision of elderly primigravida, second trimester: Secondary | ICD-10-CM | POA: Diagnosis not present

## 2018-04-06 DIAGNOSIS — Z23 Encounter for immunization: Secondary | ICD-10-CM | POA: Diagnosis not present

## 2018-04-06 DIAGNOSIS — Z3A27 27 weeks gestation of pregnancy: Secondary | ICD-10-CM | POA: Diagnosis not present

## 2018-05-26 DIAGNOSIS — O3663X0 Maternal care for excessive fetal growth, third trimester, not applicable or unspecified: Secondary | ICD-10-CM | POA: Diagnosis not present

## 2018-05-26 DIAGNOSIS — Z3A34 34 weeks gestation of pregnancy: Secondary | ICD-10-CM | POA: Diagnosis not present

## 2018-06-24 ENCOUNTER — Encounter (HOSPITAL_COMMUNITY): Payer: Self-pay | Admitting: *Deleted

## 2018-06-24 ENCOUNTER — Telehealth (HOSPITAL_COMMUNITY): Payer: Self-pay | Admitting: *Deleted

## 2018-06-24 NOTE — Telephone Encounter (Signed)
Preadmission screen  

## 2018-06-28 ENCOUNTER — Telehealth (HOSPITAL_COMMUNITY): Payer: Self-pay | Admitting: *Deleted

## 2018-06-28 ENCOUNTER — Encounter (HOSPITAL_COMMUNITY): Payer: Self-pay | Admitting: *Deleted

## 2018-06-28 NOTE — Telephone Encounter (Signed)
Preadmission screen  

## 2018-07-04 ENCOUNTER — Other Ambulatory Visit: Payer: Self-pay

## 2018-07-04 ENCOUNTER — Other Ambulatory Visit (HOSPITAL_COMMUNITY)
Admission: RE | Admit: 2018-07-04 | Discharge: 2018-07-04 | Disposition: A | Discharge status: Home / Self Care | Payer: Commercial Managed Care - PPO | Source: Ambulatory Visit | Attending: Obstetrics and Gynecology | Admitting: Obstetrics and Gynecology

## 2018-07-04 DIAGNOSIS — Z1159 Encounter for screening for other viral diseases: Secondary | ICD-10-CM | POA: Diagnosis not present

## 2018-07-04 NOTE — MAU Note (Signed)
Asymptomatic, swabbed without difficulty. 

## 2018-07-05 ENCOUNTER — Other Ambulatory Visit: Payer: Self-pay | Admitting: Obstetrics and Gynecology

## 2018-07-05 ENCOUNTER — Other Ambulatory Visit (HOSPITAL_COMMUNITY): Payer: Self-pay | Admitting: *Deleted

## 2018-07-05 LAB — NOVEL CORONAVIRUS, NAA (HOSP ORDER, SEND-OUT TO REF LAB; TAT 18-24 HRS): SARS-CoV-2, NAA: NOT DETECTED

## 2018-07-05 NOTE — H&P (Signed)
Adrienne Bishop is a 38 y.o. female G1P0 at 4 1/7 weeks (EDD 07/04/18 by known DOC with IUI and ovridel to concive with a h/o PCOS.  Prenatal care complicated by AMA with low risk Panorama.  She also has had soe intermittently elevated BP to 140-150/80-90 range but o PIH sx and labs WNL with no proteinuria.   OB History    Gravida  1   Para      Term      Preterm      AB      Living        SAB      TAB      Ectopic      Multiple      Live Births             Past Medical History:  Diagnosis Date  . Medical history non-contributory   . PCOS (polycystic ovarian syndrome)    Past Surgical History:  Procedure Laterality Date  . WISDOM TOOTH EXTRACTION     Family History: family history includes Cancer in her maternal grandfather and paternal grandfather; Diabetes in her paternal grandfather; Hypertension in her paternal grandfather and paternal grandmother; Stroke in her paternal grandfather. Social History:  reports that she has never smoked. She has never used smokeless tobacco. She reports that she does not drink alcohol or use drugs.     Maternal Diabetes: No Genetic Screening: Normal Maternal Ultrasounds/Referrals: Normal Fetal Ultrasounds or other Referrals:  None Maternal Substance Abuse:  No Significant Maternal Medications:  Meds include: Other: Metformin for PCOS Significant Maternal Lab Results:  None Other Comments:  None  Review of Systems  Constitutional: Negative for fever.  Cardiovascular: Negative for chest pain.  Gastrointestinal: Negative for abdominal pain.  Neurological: Negative for headaches.   Maternal Medical History:  Reason for admission: Contractions.   Contractions: Onset was 3-5 hours ago.   Frequency: irregular.   Perceived severity is mild.    Fetal activity: Perceived fetal activity is normal.    Prenatal complications: PIH.   Prenatal Complications - Diabetes: none.      Last menstrual period 10/02/2017. Maternal  Exam:  Uterine Assessment: Contraction strength is mild.  Contraction frequency is irregular.   Abdomen: Patient reports no abdominal tenderness. Fetal presentation: vertex  Introitus: Normal vulva. Normal vagina.    Physical Exam  Constitutional: She appears well-developed.  Cardiovascular: Normal rate and regular rhythm.  Respiratory: Effort normal.  GI: Soft.  Genitourinary:    Vulva and vagina normal.   Musculoskeletal: Normal range of motion.  Neurological: She is alert.  Psychiatric: She has a normal mood and affect.    Prenatal labs: ABO, Rh: B/Positive/-- (11/13 0000) Antibody: Negative (11/13 0000) Rubella: Immune (11/13 0000) RPR: Nonreactive (11/13 0000)  HBsAg: Negative (11/13 0000)  HIV: Non-reactive (11/13 0000)  GBS:   Negative One hour GCT 134--watching carbs Pamorama and AFP WNL   Assessment/Plan: Pt with IOL at term with intermittent elevataed BP c/w gestational hypertension.  Foley bulb placed in cervix earlier this afternoon and confirmed vertex.  Will admit and ripen with cytotec and pitocin in AM.  Check PIH labs on admission.   Oliver Pila 07/05/2018, 9:37 PM

## 2018-07-05 NOTE — H&P (Deleted)
  The note originally documented on this encounter has been moved the the encounter in which it belongs.  

## 2018-07-06 ENCOUNTER — Other Ambulatory Visit: Payer: Self-pay

## 2018-07-06 ENCOUNTER — Encounter (HOSPITAL_COMMUNITY)
Admission: AD | Disposition: A | Discharge status: Home / Self Care | Payer: Self-pay | Source: Home / Self Care | Attending: Obstetrics and Gynecology

## 2018-07-06 ENCOUNTER — Inpatient Hospital Stay (HOSPITAL_COMMUNITY): Payer: Commercial Managed Care - PPO | Admitting: Anesthesiology

## 2018-07-06 ENCOUNTER — Inpatient Hospital Stay (HOSPITAL_COMMUNITY)
Admission: AD | Admit: 2018-07-06 | Discharge: 2018-07-09 | DRG: 788 | Disposition: A | Discharge status: Home / Self Care | Payer: Commercial Managed Care - PPO | Attending: Obstetrics and Gynecology | Admitting: Obstetrics and Gynecology

## 2018-07-06 ENCOUNTER — Encounter (HOSPITAL_COMMUNITY): Payer: Self-pay

## 2018-07-06 ENCOUNTER — Inpatient Hospital Stay (HOSPITAL_COMMUNITY): Payer: Commercial Managed Care - PPO

## 2018-07-06 DIAGNOSIS — O99214 Obesity complicating childbirth: Secondary | ICD-10-CM | POA: Diagnosis present

## 2018-07-06 DIAGNOSIS — Z3A40 40 weeks gestation of pregnancy: Secondary | ICD-10-CM | POA: Diagnosis not present

## 2018-07-06 DIAGNOSIS — O133 Gestational [pregnancy-induced] hypertension without significant proteinuria, third trimester: Secondary | ICD-10-CM | POA: Diagnosis present

## 2018-07-06 DIAGNOSIS — O134 Gestational [pregnancy-induced] hypertension without significant proteinuria, complicating childbirth: Principal | ICD-10-CM | POA: Diagnosis present

## 2018-07-06 DIAGNOSIS — E282 Polycystic ovarian syndrome: Secondary | ICD-10-CM | POA: Diagnosis present

## 2018-07-06 DIAGNOSIS — Z98891 History of uterine scar from previous surgery: Secondary | ICD-10-CM

## 2018-07-06 LAB — CBC
HCT: 36.1 % (ref 36.0–46.0)
Hemoglobin: 12 g/dL (ref 12.0–15.0)
MCH: 27.7 pg (ref 26.0–34.0)
MCHC: 33.2 g/dL (ref 30.0–36.0)
MCV: 83.4 fL (ref 80.0–100.0)
Platelets: 234 10*3/uL (ref 150–400)
RBC: 4.33 MIL/uL (ref 3.87–5.11)
RDW: 15.2 % (ref 11.5–15.5)
WBC: 13.6 10*3/uL — ABNORMAL HIGH (ref 4.0–10.5)
nRBC: 0 % (ref 0.0–0.2)

## 2018-07-06 LAB — COMPREHENSIVE METABOLIC PANEL
ALT: 20 U/L (ref 0–44)
AST: 17 U/L (ref 15–41)
Albumin: 2.4 g/dL — ABNORMAL LOW (ref 3.5–5.0)
Alkaline Phosphatase: 162 U/L — ABNORMAL HIGH (ref 38–126)
Anion gap: 10 (ref 5–15)
BUN: 10 mg/dL (ref 6–20)
CO2: 18 mmol/L — ABNORMAL LOW (ref 22–32)
Calcium: 8.7 mg/dL — ABNORMAL LOW (ref 8.9–10.3)
Chloride: 107 mmol/L (ref 98–111)
Creatinine, Ser: 0.55 mg/dL (ref 0.44–1.00)
GFR calc Af Amer: >60 mL/min (ref >60)
GFR calc non Af Amer: >60 mL/min (ref >60)
Glucose, Bld: 85 mg/dL (ref 70–99)
Potassium: 4.1 mmol/L (ref 3.5–5.1)
Sodium: 135 mmol/L (ref 135–145)
Total Bilirubin: 0.8 mg/dL (ref 0.3–1.2)
Total Protein: 6 g/dL — ABNORMAL LOW (ref 6.5–8.1)

## 2018-07-06 LAB — ABO/RH: ABO/RH(D): B POS

## 2018-07-06 LAB — TYPE AND SCREEN
ABO/RH(D): B POS
Antibody Screen: NEGATIVE

## 2018-07-06 LAB — RPR: RPR Ser Ql: NONREACTIVE

## 2018-07-06 LAB — PROTEIN / CREATININE RATIO, URINE
Creatinine, Urine: 16.9 mg/dL
Total Protein, Urine: <6 mg/dL

## 2018-07-06 SURGERY — Surgical Case
Anesthesia: Epidural

## 2018-07-06 MED ORDER — EPHEDRINE 5 MG/ML INJ: 10.0000 mg | INTRAVENOUS | Status: DC | PRN

## 2018-07-06 MED ORDER — LIDOCAINE HCL (PF) 1 % IJ SOLN
INTRAMUSCULAR | Status: DC | PRN
  Administered 2018-07-06: 11 mL via EPIDURAL

## 2018-07-06 MED ORDER — OXYCODONE HCL 5 MG PO TABS: 5.0000 mg | ORAL_TABLET | ORAL | Status: DC | PRN

## 2018-07-06 MED ORDER — SODIUM CHLORIDE 0.9 % IR SOLN
Status: DC | PRN
  Administered 2018-07-06: 1

## 2018-07-06 MED ORDER — LACTATED RINGERS IV SOLN
INTRAVENOUS | Status: DC
  Administered 2018-07-06 – 2018-07-07 (×2): via INTRAVENOUS

## 2018-07-06 MED ORDER — ONDANSETRON HCL 4 MG/2ML IJ SOLN: 4.0000 mg | Freq: Four times a day (QID) | INTRAMUSCULAR | Status: DC | PRN

## 2018-07-06 MED ORDER — SCOPOLAMINE 1 MG/3DAYS TD PT72
1.0000 | MEDICATED_PATCH | Freq: Once | TRANSDERMAL | Status: DC
  Administered 2018-07-06: 1.5 mg via TRANSDERMAL

## 2018-07-06 MED ORDER — OXYTOCIN 40 UNITS IN NORMAL SALINE INFUSION - SIMPLE MED: 2.5000 [IU]/h | INTRAVENOUS | Status: AC

## 2018-07-06 MED ORDER — LACTATED RINGERS IV SOLN
500.0000 mL | INTRAVENOUS | Status: DC | PRN
  Administered 2018-07-06: 500 mL via INTRAVENOUS
  Administered 2018-07-06: 1000 mL via INTRAVENOUS

## 2018-07-06 MED ORDER — DIPHENHYDRAMINE HCL 25 MG PO CAPS: 25.0000 mg | ORAL_CAPSULE | Freq: Four times a day (QID) | ORAL | Status: DC | PRN

## 2018-07-06 MED ORDER — SODIUM BICARBONATE 8.4 % IV SOLN
INTRAVENOUS | Status: AC
  Filled 2018-07-06: qty 50

## 2018-07-06 MED ORDER — MORPHINE SULFATE (PF) 0.5 MG/ML IJ SOLN
INTRAMUSCULAR | Status: AC
  Filled 2018-07-06: qty 10

## 2018-07-06 MED ORDER — DIPHENHYDRAMINE HCL 50 MG/ML IJ SOLN: 12.5000 mg | INTRAMUSCULAR | Status: DC | PRN

## 2018-07-06 MED ORDER — SCOPOLAMINE 1 MG/3DAYS TD PT72
MEDICATED_PATCH | TRANSDERMAL | Status: AC
  Filled 2018-07-06: qty 1

## 2018-07-06 MED ORDER — CEFAZOLIN SODIUM-DEXTROSE 2-4 GM/100ML-% IV SOLN
INTRAVENOUS | Status: AC
  Filled 2018-07-06: qty 100

## 2018-07-06 MED ORDER — SIMETHICONE 80 MG PO CHEW
80.0000 mg | CHEWABLE_TABLET | Freq: Three times a day (TID) | ORAL | Status: DC
  Administered 2018-07-07 – 2018-07-09 (×6): 80 mg via ORAL
  Filled 2018-07-06 (×7): qty 1

## 2018-07-06 MED ORDER — ONDANSETRON HCL 4 MG/2ML IJ SOLN: 4.0000 mg | Freq: Three times a day (TID) | INTRAMUSCULAR | Status: DC | PRN

## 2018-07-06 MED ORDER — OXYTOCIN 40 UNITS IN NORMAL SALINE INFUSION - SIMPLE MED
1.0000 m[IU]/min | INTRAVENOUS | Status: DC
  Administered 2018-07-06 (×2): 2 m[IU]/min via INTRAVENOUS
  Filled 2018-07-06: qty 1000

## 2018-07-06 MED ORDER — LACTATED RINGERS IV SOLN
INTRAVENOUS | Status: DC
  Administered 2018-07-06 (×4): via INTRAVENOUS

## 2018-07-06 MED ORDER — OXYCODONE HCL 5 MG PO TABS: 5.0000 mg | ORAL_TABLET | Freq: Once | ORAL | Status: DC | PRN

## 2018-07-06 MED ORDER — LACTATED RINGERS AMNIOINFUSION
INTRAVENOUS | Status: DC
  Administered 2018-07-06: 12:00:00 via INTRAUTERINE

## 2018-07-06 MED ORDER — CEFAZOLIN SODIUM-DEXTROSE 2-3 GM-%(50ML) IV SOLR
INTRAVENOUS | Status: DC | PRN
  Administered 2018-07-06: 2 g via INTRAVENOUS

## 2018-07-06 MED ORDER — KETOROLAC TROMETHAMINE 30 MG/ML IJ SOLN
INTRAMUSCULAR | Status: AC
  Filled 2018-07-06: qty 1

## 2018-07-06 MED ORDER — LIDOCAINE-EPINEPHRINE (PF) 2 %-1:200000 IJ SOLN
INTRAMUSCULAR | Status: DC | PRN
  Administered 2018-07-06: 3 mL via EPIDURAL
  Administered 2018-07-06: 4 mL via EPIDURAL
  Administered 2018-07-06: 7 mL via EPIDURAL

## 2018-07-06 MED ORDER — MISOPROSTOL 25 MCG QUARTER TABLET: 25.0000 ug | ORAL_TABLET | ORAL | Status: DC

## 2018-07-06 MED ORDER — WITCH HAZEL-GLYCERIN EX PADS: 1.0000 "application " | MEDICATED_PAD | CUTANEOUS | Status: DC | PRN

## 2018-07-06 MED ORDER — SODIUM CHLORIDE 0.9 % IV SOLN
INTRAVENOUS | Status: DC | PRN
  Administered 2018-07-06: 17:00:00 via INTRAVENOUS

## 2018-07-06 MED ORDER — SODIUM CHLORIDE 0.9 % IV SOLN
INTRAVENOUS | Status: DC | PRN
  Administered 2018-07-06: 40 [IU] via INTRAVENOUS

## 2018-07-06 MED ORDER — PHENYLEPHRINE 40 MCG/ML (10ML) SYRINGE FOR IV PUSH (FOR BLOOD PRESSURE SUPPORT)
80.0000 ug | PREFILLED_SYRINGE | INTRAVENOUS | Status: DC | PRN
  Filled 2018-07-06: qty 10

## 2018-07-06 MED ORDER — SODIUM CHLORIDE (PF) 0.9 % IJ SOLN
INTRAMUSCULAR | Status: DC | PRN
  Administered 2018-07-06: 14 mL/h via EPIDURAL

## 2018-07-06 MED ORDER — TETANUS-DIPHTH-ACELL PERTUSSIS 5-2.5-18.5 LF-MCG/0.5 IM SUSP: 0.5000 mL | Freq: Once | INTRAMUSCULAR | Status: DC

## 2018-07-06 MED ORDER — ZOLPIDEM TARTRATE 5 MG PO TABS: 5.0000 mg | ORAL_TABLET | Freq: Every evening | ORAL | Status: DC | PRN

## 2018-07-06 MED ORDER — NALOXONE HCL 0.4 MG/ML IJ SOLN: 0.4000 mg | INTRAMUSCULAR | Status: DC | PRN

## 2018-07-06 MED ORDER — SENNOSIDES-DOCUSATE SODIUM 8.6-50 MG PO TABS
2.0000 | ORAL_TABLET | ORAL | Status: DC
  Administered 2018-07-06 – 2018-07-08 (×3): 2 via ORAL
  Filled 2018-07-06 (×3): qty 2

## 2018-07-06 MED ORDER — PRENATAL MULTIVITAMIN CH
1.0000 | ORAL_TABLET | Freq: Every day | ORAL | Status: DC
  Administered 2018-07-07 – 2018-07-08 (×2): 1 via ORAL
  Filled 2018-07-06 (×2): qty 1

## 2018-07-06 MED ORDER — DIPHENHYDRAMINE HCL 25 MG PO CAPS: 25.0000 mg | ORAL_CAPSULE | ORAL | Status: DC | PRN

## 2018-07-06 MED ORDER — COCONUT OIL OIL: 1.0000 "application " | TOPICAL_OIL | Status: DC | PRN

## 2018-07-06 MED ORDER — STERILE WATER FOR IRRIGATION IR SOLN
Status: DC | PRN
  Administered 2018-07-06: 1000 mL

## 2018-07-06 MED ORDER — MEPERIDINE HCL 25 MG/ML IJ SOLN: 6.2500 mg | INTRAMUSCULAR | Status: DC | PRN

## 2018-07-06 MED ORDER — SIMETHICONE 80 MG PO CHEW
80.0000 mg | CHEWABLE_TABLET | ORAL | Status: DC
  Administered 2018-07-06 – 2018-07-08 (×3): 80 mg via ORAL
  Filled 2018-07-06 (×3): qty 1

## 2018-07-06 MED ORDER — LACTATED RINGERS IV SOLN
500.0000 mL | Freq: Once | INTRAVENOUS | Status: AC
  Administered 2018-07-06: 500 mL via INTRAVENOUS

## 2018-07-06 MED ORDER — OXYCODONE HCL 5 MG/5ML PO SOLN: 5.0000 mg | Freq: Once | ORAL | Status: DC | PRN

## 2018-07-06 MED ORDER — TERBUTALINE SULFATE 1 MG/ML IJ SOLN: 0.2500 mg | Freq: Once | INTRAMUSCULAR | Status: DC | PRN

## 2018-07-06 MED ORDER — PROMETHAZINE HCL 25 MG/ML IJ SOLN: 6.2500 mg | INTRAMUSCULAR | Status: DC | PRN

## 2018-07-06 MED ORDER — MORPHINE SULFATE (PF) 0.5 MG/ML IJ SOLN
INTRAMUSCULAR | Status: DC | PRN
  Administered 2018-07-06: 3 mg via EPIDURAL

## 2018-07-06 MED ORDER — NALBUPHINE HCL 10 MG/ML IJ SOLN: 5.0000 mg | INTRAMUSCULAR | Status: DC | PRN

## 2018-07-06 MED ORDER — ONDANSETRON HCL 4 MG/2ML IJ SOLN
INTRAMUSCULAR | Status: DC | PRN
  Administered 2018-07-06: 4 mg via INTRAVENOUS

## 2018-07-06 MED ORDER — NALOXONE HCL 4 MG/10ML IJ SOLN
1.0000 ug/kg/h | INTRAVENOUS | Status: DC | PRN
  Filled 2018-07-06: qty 5

## 2018-07-06 MED ORDER — OXYTOCIN BOLUS FROM INFUSION: 500.0000 mL | Freq: Once | INTRAVENOUS | Status: DC

## 2018-07-06 MED ORDER — SODIUM CHLORIDE 0.9% FLUSH: 3.0000 mL | INTRAVENOUS | Status: DC | PRN

## 2018-07-06 MED ORDER — LIDOCAINE HCL (PF) 1 % IJ SOLN: 30.0000 mL | INTRAMUSCULAR | Status: DC | PRN

## 2018-07-06 MED ORDER — OXYCODONE-ACETAMINOPHEN 5-325 MG PO TABS: 2.0000 | ORAL_TABLET | ORAL | Status: DC | PRN

## 2018-07-06 MED ORDER — ONDANSETRON HCL 4 MG/2ML IJ SOLN
INTRAMUSCULAR | Status: AC
  Filled 2018-07-06: qty 2

## 2018-07-06 MED ORDER — NALBUPHINE HCL 10 MG/ML IJ SOLN: 5.0000 mg | Freq: Once | INTRAMUSCULAR | Status: DC | PRN

## 2018-07-06 MED ORDER — ACETAMINOPHEN 500 MG PO TABS
1000.0000 mg | ORAL_TABLET | Freq: Four times a day (QID) | ORAL | Status: DC
  Administered 2018-07-06 – 2018-07-09 (×10): 1000 mg via ORAL
  Filled 2018-07-06 (×11): qty 2

## 2018-07-06 MED ORDER — OXYTOCIN 40 UNITS IN NORMAL SALINE INFUSION - SIMPLE MED: 2.5000 [IU]/h | INTRAVENOUS | Status: DC

## 2018-07-06 MED ORDER — OXYTOCIN 40 UNITS IN NORMAL SALINE INFUSION - SIMPLE MED
INTRAVENOUS | Status: AC
  Filled 2018-07-06: qty 1000

## 2018-07-06 MED ORDER — SOD CITRATE-CITRIC ACID 500-334 MG/5ML PO SOLN
30.0000 mL | ORAL | Status: DC | PRN
  Administered 2018-07-06: 30 mL via ORAL
  Filled 2018-07-06: qty 30

## 2018-07-06 MED ORDER — OXYCODONE-ACETAMINOPHEN 5-325 MG PO TABS: 1.0000 | ORAL_TABLET | ORAL | Status: DC | PRN

## 2018-07-06 MED ORDER — KETOROLAC TROMETHAMINE 30 MG/ML IJ SOLN
30.0000 mg | Freq: Once | INTRAMUSCULAR | Status: AC | PRN
  Administered 2018-07-06: 30 mg via INTRAVENOUS

## 2018-07-06 MED ORDER — ACETAMINOPHEN 325 MG PO TABS: 650.0000 mg | ORAL_TABLET | ORAL | Status: DC | PRN

## 2018-07-06 MED ORDER — SIMETHICONE 80 MG PO CHEW: 80.0000 mg | CHEWABLE_TABLET | ORAL | Status: DC | PRN

## 2018-07-06 MED ORDER — HYDROMORPHONE HCL 1 MG/ML IJ SOLN: 0.2500 mg | INTRAMUSCULAR | Status: DC | PRN

## 2018-07-06 MED ORDER — FENTANYL-BUPIVACAINE-NACL 0.5-0.125-0.9 MG/250ML-% EP SOLN
12.0000 mL/h | EPIDURAL | Status: DC | PRN
  Filled 2018-07-06: qty 250

## 2018-07-06 MED ORDER — MENTHOL 3 MG MT LOZG: 1.0000 | LOZENGE | OROMUCOSAL | Status: DC | PRN

## 2018-07-06 MED ORDER — IBUPROFEN 800 MG PO TABS
800.0000 mg | ORAL_TABLET | Freq: Three times a day (TID) | ORAL | Status: DC
  Administered 2018-07-07 – 2018-07-09 (×7): 800 mg via ORAL
  Filled 2018-07-06 (×8): qty 1

## 2018-07-06 MED ORDER — DIBUCAINE (PERIANAL) 1 % EX OINT: 1.0000 "application " | TOPICAL_OINTMENT | CUTANEOUS | Status: DC | PRN

## 2018-07-06 MED ORDER — PHENYLEPHRINE 40 MCG/ML (10ML) SYRINGE FOR IV PUSH (FOR BLOOD PRESSURE SUPPORT): 80.0000 ug | PREFILLED_SYRINGE | INTRAVENOUS | Status: DC | PRN

## 2018-07-06 SURGICAL SUPPLY — 33 items
BENZOIN TINCTURE PRP APPL 2/3 (GAUZE/BANDAGES/DRESSINGS) ×2 IMPLANT
CHLORAPREP W/TINT 26ML (MISCELLANEOUS) ×2 IMPLANT
CLAMP CORD UMBIL (MISCELLANEOUS) IMPLANT
CLOTH BEACON ORANGE TIMEOUT ST (SAFETY) ×2 IMPLANT
DRSG OPSITE POSTOP 4X10 (GAUZE/BANDAGES/DRESSINGS) ×2 IMPLANT
ELECT REM PT RETURN 9FT ADLT (ELECTROSURGICAL) ×2
ELECTRODE REM PT RTRN 9FT ADLT (ELECTROSURGICAL) ×1 IMPLANT
EXTRACTOR VACUUM KIWI (MISCELLANEOUS) IMPLANT
GLOVE BIO SURGEON STRL SZ 6.5 (GLOVE) ×2 IMPLANT
GLOVE BIOGEL PI IND STRL 7.0 (GLOVE) ×1 IMPLANT
GLOVE BIOGEL PI INDICATOR 7.0 (GLOVE) ×1
GOWN STRL REUS W/TWL LRG LVL3 (GOWN DISPOSABLE) ×4 IMPLANT
KIT ABG SYR 3ML LUER SLIP (SYRINGE) ×2 IMPLANT
NEEDLE HYPO 25X5/8 SAFETYGLIDE (NEEDLE) ×2 IMPLANT
NS IRRIG 1000ML POUR BTL (IV SOLUTION) ×2 IMPLANT
PACK C SECTION WH (CUSTOM PROCEDURE TRAY) ×2 IMPLANT
PAD OB MATERNITY 4.3X12.25 (PERSONAL CARE ITEMS) ×2 IMPLANT
PENCIL SMOKE EVAC W/HOLSTER (ELECTROSURGICAL) ×2 IMPLANT
RTRCTR C-SECT PINK 25CM LRG (MISCELLANEOUS) ×2 IMPLANT
STRIP CLOSURE SKIN 1/2X4 (GAUZE/BANDAGES/DRESSINGS) ×2 IMPLANT
SUT CHROMIC 1 CTX 36 (SUTURE) ×4 IMPLANT
SUT PLAIN 0 NONE (SUTURE) IMPLANT
SUT PLAIN 2 0 XLH (SUTURE) ×4 IMPLANT
SUT VIC AB 0 CT1 27 (SUTURE) ×2
SUT VIC AB 0 CT1 27XBRD ANBCTR (SUTURE) ×2 IMPLANT
SUT VIC AB 2-0 CT1 27 (SUTURE) ×1
SUT VIC AB 2-0 CT1 TAPERPNT 27 (SUTURE) ×1 IMPLANT
SUT VIC AB 3-0 CT1 27 (SUTURE)
SUT VIC AB 3-0 CT1 TAPERPNT 27 (SUTURE) IMPLANT
SUT VIC AB 4-0 KS 27 (SUTURE) ×2 IMPLANT
TOWEL OR 17X24 6PK STRL BLUE (TOWEL DISPOSABLE) ×2 IMPLANT
TRAY FOLEY W/BAG SLVR 14FR LF (SET/KITS/TRAYS/PACK) ×2 IMPLANT
WATER STERILE IRR 1000ML POUR (IV SOLUTION) ×2 IMPLANT

## 2018-07-06 NOTE — Op Note (Signed)
Operative Note    Preoperative Diagnosis Persistent Category 2 tracing Term pregnancy at 40 2/7 weeks   Postoperative Diagnosis Same with double nuchal cord x 2  Procedure Primary low transverse c-section with 2 layer closure of uterus  Surgeon Huel CoteKathy Jguadalupe Opiela, MD  Anesthesia Epidural  Fluids: EBL 214mL UOP 200mL clear IVF 2200mL  Findings A viable female infant in the vertex presentation.  Nuchal cord x 2.  The infant was unexpectedly floppy and a Code Apgar was called.  Apgars were 0,1,5 and the baby required epinephrine x 2 to get the HR above 100.  The baby was intubated in the OR and once HR recovered had improved tone and was breathing on his own but still intubated.   The placenta appeared normal as well as the pelvic anatomy.  Cord pH was obtained and there was a delay in running it but was 7.34.    Specimen Placenta to L&D  Procedure Note  Patient was taken to the operating room where epidural anesthesia was found to be adequate by Allis clamp test.  The FHR was 130 just prior to prepping the abdomen.  She was prepped and draped in the normal sterile fashion in the dorsal supine position with a leftward tilt. An appropriate time out was performed. A Pfannenstiel skin incision was then made with the scalpel and carried through to the underlying layer of fascia by sharp dissection and Bovie cautery. The fascia was nicked in the midline and the incision was extended laterally with Mayo scissors. The inferior aspect of the incision was grasped Coker clamps and dissected off the underlying rectus muscles. In a similar fashion the superior aspect was dissected off the rectus muscles. Rectus muscles were separated in the midline and the peritoneal cavity entered bluntly. The peritoneal incision was then extended both superiorly and inferiorly with careful attention to avoid both bowel and bladder. The Alexis self-retaining wound retractor was then placed within the incision and the  lower uterine segment exposed. The bladder flap was developed with Metzenbaum scissors and pushed away from the lower uterine segment. The lower uterine segment was then incised in a transverse fashion and the cavity itself entered bluntly. The incision was extended bluntly. The infant's head was then lifted and delivered from the incision without difficulty. There was a double nuchal cord reduced.  The infant open his eyes but then became very floppy and did not make any respiratory effort so the cord was milked and cut and the baby immediately handed to the NICU team.  The placenta was then spontaneously expressed from the uterus and the uterus cleared of all clots and debris with moist lap sponge. The uterine incision was then repaired in 2 layers the first layer was a running locked layer 1-0 chromic and the second an imbricating layer of the same suture. The tubes and ovaries were inspected and the gutters cleared of all clots and debris. The uterine incision was inspected and found to be hemostatic. All instruments and sponges as well as the Alexis retractor were then removed from the abdomen. The rectus muscles and peritoneum was then closed with 2-0 vicryl in a running fashion.   The fascia was then closed with 0 Vicryl in a running fashion. Subcutaneous tissue was reapproximated with 3-0 plain in a running fashion. The skin was closed with a subcuticular stitch of 4-0 Vicryl on a Keith needle and then reinforced with benzoin and Steri-Strips. At the conclusion of the procedure all instruments and sponge counts were  correct. Patient was taken to the recovery room and the baby was taken to the NICU with father attending.

## 2018-07-06 NOTE — Progress Notes (Signed)
Patient ID: Adrienne Bishop, female   DOB: 11/25/1980, 38 y.o.   MRN: [id number] Pt just received epidural and more comfortable.  afeb BP 130/80-96 FHR with good variability and intermittent variable decelerations, one more prolonged to 60 x 2-3 minutes about an hour ago but recovered well.    Cervix 60/4-5/-3  IUPC and FSE placed   Now that epidural on board and FHR back to baseline 130-140 with good variability, will start pitocin. D/w pt we will proceed with IOL as long as FHR tolerates and she is in agreement.

## 2018-07-06 NOTE — Progress Notes (Signed)
Patient ID: Adrienne Bishop, female   DOB: 1980-11-07, 38 y.o.   MRN: [id number] Pt has had some persistent variable decels over last 2 hours, most mild but occasional deeper variables.  Usually improve to mild with position change.  Excellent variability and + accels.    Cervix 80/5/-1  Vertex is descending well.   Will try amnioinfusion to resolve variables since making progress.   FSE applied and will continue to follow FHR closely

## 2018-07-06 NOTE — Progress Notes (Signed)
Patient ID: Adrienne Bishop, female   DOB: [birth year], 38 y.o.   MRN: [id number] Late entry note from exam 1300  Pt comfortable with epidural  FHR tracing continues to have some intermittent variable with amnioinfusion but not persistent and continued good variability.  Cervix 80/5/-1 Feels OP  Placed in left Sims position and will continue to follow closely

## 2018-07-06 NOTE — Progress Notes (Addendum)
Patient ID: Adrienne Bishop, female   DOB: [birth year], 38 y.o.   MRN: [id number] Fetal heart rated noted to decelerate into the 60s for approx 4-41mins Resolution noted with position change and pitocin off FSE placed for better monitoring SVE - 5/65/-3; fetal head now ballotable  Plan: placed pt in high fowlers          Expectant mgmt

## 2018-07-06 NOTE — Progress Notes (Signed)
Patient ID: Adrienne Bishop, female   DOB: 06/09/1980, 38 y.o.   MRN: [id number] Pt comfortable  Tracing continues to have runs of late decelerations and deep variable decelerations that are more persistent and do not resolve completely with repositioning and interventions.  Still good variability.  80/5-6/-1 OP  D/w pt that since we are still remote from delivery I feel safest to proceed with c-section.  Tracing is category 2 persistently now and not as responsive to interventions.  Pt is in agreement.  We discussed risks of bleeding, infection, and possible damage to bowel and bladder aa well as went over the surgery in detail.  She is ready to proceed.  OR preparing and we will proceed when ready.  Pitocin off and deep variables improved.

## 2018-07-06 NOTE — Anesthesia Procedure Notes (Signed)
Epidural Patient location during procedure: OB Start time: 07/06/2018 8:32 AM End time: 07/06/2018 8:44 AM  Staffing Anesthesiologist: Lowella Curb, MD Performed: anesthesiologist   Preanesthetic Checklist Completed: patient identified, site marked, surgical consent, pre-op evaluation, timeout performed, IV checked, risks and benefits discussed and monitors and equipment checked  Epidural Patient position: sitting Prep: ChloraPrep Patient monitoring: heart rate, cardiac monitor, continuous pulse ox and blood pressure Approach: midline Location: L2-L3 Injection technique: LOR saline  Needle:  Needle type: Tuohy  Needle gauge: 17 G Needle length: 9 cm Needle insertion depth: 7 cm Catheter type: closed end flexible Catheter size: 20 Guage Catheter at skin depth: 12 cm Test dose: negative  Assessment Events: blood not aspirated, injection not painful, no injection resistance, negative IV test and no paresthesia  Additional Notes Reason for block:procedure for pain

## 2018-07-06 NOTE — Progress Notes (Signed)
Patient ID: Adrienne Bishop, female   DOB: 1980/03/09, 38 y.o.   MRN: [id number] FHR deceleration noted into the 80s with patient in high fowlers Pt rotated first to right then to left with resolution FSE was replaced in interim; Cervix unchanged at 5/65/-3  Keep pitocin off at this time; Reassess prn

## 2018-07-06 NOTE — Anesthesia Preprocedure Evaluation (Signed)
Anesthesia Evaluation  Patient identified by MRN, date of birth, ID band Patient awake    Reviewed: Allergy & Precautions, NPO status , Patient's Chart, lab work & pertinent test results  Airway Mallampati: II  TM Distance: >3 FB Neck ROM: Full    Dental no notable dental hx.    Pulmonary neg pulmonary ROS,    Pulmonary exam normal breath sounds clear to auscultation       Cardiovascular hypertension, Normal cardiovascular exam Rhythm:Regular Rate:Normal     Neuro/Psych negative neurological ROS  negative psych ROS   GI/Hepatic negative GI ROS, Neg liver ROS,   Endo/Other  Morbid obesity  Renal/GU negative Renal ROS  negative genitourinary   Musculoskeletal negative musculoskeletal ROS (+)   Abdominal (+) + obese,   Peds negative pediatric ROS (+)  Hematology negative hematology ROS (+)   Anesthesia Other Findings   Reproductive/Obstetrics (+) Pregnancy                             Anesthesia Physical Anesthesia Plan  ASA: III  Anesthesia Plan: Epidural   Post-op Pain Management:    Induction:   PONV Risk Score and Plan:   Airway Management Planned:   Additional Equipment:   Intra-op Plan:   Post-operative Plan:   Informed Consent:   Plan Discussed with:   Anesthesia Plan Comments:         Anesthesia Quick Evaluation  

## 2018-07-06 NOTE — Transfer of Care (Signed)
Immediate Anesthesia Transfer of Care Note  Patient: Adrienne Bishop  Procedure(s) Performed: CESAREAN SECTION (N/A )  Patient Location: PACU  Anesthesia Type:Epidural  Level of Consciousness: awake, alert  and oriented  Airway & Oxygen Therapy: Patient Spontanous Breathing  Post-op Assessment: Report given to RN and Post -op Vital signs reviewed and stable  Post vital signs: Reviewed and stable  Last Vitals:  Vitals Value Taken Time  BP 142/79 07/06/2018  5:49 PM  Temp    Pulse 114 07/06/2018  5:59 PM  Resp 16 07/06/2018  5:59 PM  SpO2 100 % 07/06/2018  5:59 PM  Vitals shown include unvalidated device data.  Last Pain:  Vitals:   07/06/18 1430  TempSrc: Oral  PainSc:          Complications: No apparent anesthesia complications

## 2018-07-06 NOTE — Progress Notes (Addendum)
Patient ID: Adrienne Bishop, female   DOB: 16-Jun-1980, 38 y.o.   MRN: [id number] Pt reports foley bulb fell out at home at approx 930-10pm last night but had no contractions or pelvic pain at the time. She arrived at scheduled time for iol last night at 1145pm. On arrival, pt noted to be 3.5cm dilated; cat 1 strip with baseline of 130s; some contractions Pt started on pitocin for augmentation  Approximately an hour ago, fetal heart rate noted to have some variable decelerations. Position changes were employed with resolution. This recurred about later but slower to resolve Pitocin was stopped to allow recovery.  Contractions still noted q  Rechecked pt  - 4.5/60/-2, ant cervix ;  AROM performed -copious clear fluid noted Plan - expectant mgmt for anticipated svd

## 2018-07-07 ENCOUNTER — Encounter (HOSPITAL_COMMUNITY): Payer: Self-pay | Admitting: Obstetrics and Gynecology

## 2018-07-07 LAB — CBC
HCT: 29.1 % — ABNORMAL LOW (ref 36.0–46.0)
Hemoglobin: 9.5 g/dL — ABNORMAL LOW (ref 12.0–15.0)
MCH: 28.2 pg (ref 26.0–34.0)
MCHC: 32.6 g/dL (ref 30.0–36.0)
MCV: 86.4 fL (ref 80.0–100.0)
Platelets: 177 10*3/uL (ref 150–400)
RBC: 3.37 MIL/uL — ABNORMAL LOW (ref 3.87–5.11)
RDW: 15.7 % — ABNORMAL HIGH (ref 11.5–15.5)
WBC: 11.6 10*3/uL — ABNORMAL HIGH (ref 4.0–10.5)
nRBC: 0 % (ref 0.0–0.2)

## 2018-07-07 LAB — GLUCOSE, CAPILLARY: Glucose-Capillary: 75 mg/dL (ref 70–99)

## 2018-07-07 NOTE — Anesthesia Postprocedure Evaluation (Signed)
Anesthesia Post Note  Patient: Adrienne Bishop  Procedure(s) Performed: CESAREAN SECTION (N/A )     Patient location during evaluation: Mother Baby Anesthesia Type: Epidural Level of consciousness: awake and alert Pain management: pain level controlled Vital Signs Assessment: post-procedure vital signs reviewed and stable Respiratory status: spontaneous breathing, nonlabored ventilation and respiratory function stable Cardiovascular status: stable Postop Assessment: no headache, no backache and epidural receding Anesthetic complications: no    Last Vitals:  Vitals:   07/07/18 0100 07/07/18 0551  BP: (!) 114/59 (!) 110/54  Pulse: 94 97  Resp: 18 20  Temp: 37.2 C 37.1 C  SpO2: 99% 99%    Last Pain:  Vitals:   07/07/18 0650  TempSrc:   PainSc: 0-No pain                 Lowella Curb

## 2018-07-07 NOTE — Progress Notes (Addendum)
Subjective: Postpartum Day 1: Cesarean Delivery Patient reports incisional pain and tolerating PO.    Objective: Vital signs in last 24 hours: Temp:  [97.8 F (36.6 C)-99.1 F (37.3 C)] 98.8 F (37.1 C) (06/04 0551) Pulse Rate:  [78-137] 97 (06/04 0551) Resp:  [16-21] 20 (06/04 0551) BP: (109-150)/(48-96) 110/54 (06/04 0551) SpO2:  [98 %-100 %] 99 % (06/04 0551)  Physical Exam:  General: alert and no distress Lochia: appropriate Uterine Fundus: firm Incision: healing well DVT Evaluation: No evidence of DVT seen on physical exam.  Recent Labs    07/06/18 0036 07/07/18 0427  HGB 12.0 9.5*  HCT 36.1 29.1*    Assessment/Plan: Status post Cesarean section. Doing well postoperatively.  Continue current care. Baby in [facility] - room air  Adrienne Bishop 07/07/2018, 7:37 AM

## 2018-07-07 NOTE — Lactation Note (Addendum)
This note was copied from a baby's chart. Lactation Consultation Note:  P1, infant is 40.2 weeks and is in the NICU.  Mother has placed infant STS and plans to try and breastfeed later this evening.  Mother was sat up with a DEBP last evening by staff nurse. Mother reports that she has pumped several times . She has not see any colostrum.  Mother taught hand expression. Obtained a few drops in a colostrum vial.   Mother has areola edema.  [facility] taught mother reverse pressure and gave her shells. Mother reports that her nipples are normally erect. Nipples appear semi-flat at present. Notified her staff nurse to assist her with her putting her bra on so she can benefit from shells.   Mother was also given a hand pump and assist with pumping for several mins. Observed large drops of colostrum with use of the hand pump.  Mothers plan is to hand express into colostrum vial and then pump with hand pump or DEBP every 2-3 hours for 15-20 mins.   Mother was given Breastfeeding Your NICU baby and the Lactation brochure. Mother was advised to page for [facility] when she goes to NICU to breastfeed infant.   Discussed possible need for the use of a nipple shield if unable to latch infant. Teaching done on basic breastfeeding and on latch technique.  Mother receptive to all teaching.  Father present and very supportive.  Mother informed of all [facility] resources for lactation.   Patient Name: Adrienne Bishop [id number] Date: 07/07/2018 Reason for consult: Initial assessment   Maternal Data    Feeding    LATCH Score                   Interventions Interventions: Breast massage;Hand express;Expressed milk;Hand pump;DEBP  Lactation Tools Discussed/Used     Consult Status Consult Status: Follow-up Date: 07/07/18 Follow-up type: In-patient    Adrienne Bishop [facility] 07/07/2018, 12:54 PM

## 2018-07-07 NOTE — Lactation Note (Signed)
This note was copied from a baby's chart. Lactation Consultation Note  Patient Name: Adrienne Bishop [id number] Date: 07/07/2018 Reason for consult: Follow-up assessment;Term;NICU baby;Difficult latch;Other (Comment)(mom has challenging tissue for latch -latch will take time and NS indicated )  2nd LC visit for this baby in NICU .  LC was called by the RN to assess for a NS due to DL.  LC sized for #24 NS 1st and it was to large for baby's mouth.  The #20 NS fits better , but when the baby actually starts sucking well potentially  Will be to small and snug.  LC discussed this with mom.  Baby was spitty at consult and bulb syringe used x 2 . LC reviewed with mom.  This breast feeding session was a learning experience for baby with NS and he  Has the right idea, just wasn't in to feeding.  Mom aware of how to clean the NS's with fairly warm water soapy water ( dish soap ) and rinse with warm water.  Per  Mom has been wearing her shells and pumping every 2-3 hours.   Maternal Data Has patient been taught Hand Expression?: Yes  Feeding Feeding Type: Breast Fed(sized for NS )  LATCH Score Latch: Repeated attempts needed to sustain latch, nipple held in mouth throughout feeding, stimulation needed to elicit sucking reflex.  Audible Swallowing: None  Type of Nipple: Flat  Comfort (Breast/Nipple): Soft / non-tender  Hold (Positioning): Assistance needed to correctly position infant at breast and maintain latch.  LATCH Score: 5  Interventions Interventions: Breast feeding basics reviewed;Assisted with latch;Skin to skin;Breast massage;Reverse pressure;Breast compression;Adjust position;Support pillows;Position options;DEBP;Shells  Lactation Tools Discussed/Used Tools: Shells(mom alraedy has shells and DEBP and has been pumping ) Shell Type: Inverted Breast pump type: Double-Electric Breast Pump   Consult Status Consult Status: Follow-up Date: 07/08/18 Follow-up type:  In-patient    Matilde Sprang Vaibhav Fogleman 07/07/2018, 6:19 PM

## 2018-07-07 NOTE — Clinical Social Work Maternal (Signed)
CLINICAL SOCIAL WORK MATERNAL/CHILD NOTE  Patient Details  Name: Adrienne Bishop MRN: [id number] Date of Birth: [birth year]  Date:  07/07/2018  Clinical Social Worker Initiating Note:  Laurey Arrow Date/Time: Initiated:  07/07/18/1156     Child's Name:  Adrienne Bishop   Biological Parents:  Mother, Father   Need for Interpreter:  None   Reason for Referral:  Parental Support of Premature Babies < 32 weeks/or Critically Ill babies(Infant had a code Apgar)   Address:  [place] [address]    Phone number:  [phone] (home)     Additional phone number:   Household Members/Support Persons (HM/SP):   Household Member/Support Person 1   HM/SP Name Relationship DOB or Age  HM/SP -[place]  FOB [birth year]1982  HM/SP -2        HM/SP -3        HM/SP -4        HM/SP -5        HM/SP -6        HM/SP -7        HM/SP -8          Natural Supports (not living in the home):  Immediate Family, Extended Family, Parent   Professional Supports: None   Employment: Full-time   Type of Work: Veterinary surgeon at [employer]:  [place] arranged:    Museum/gallery curator Resources:  Multimedia programmer   Other Resources:      Cultural/Religious Considerations Which May Impact Care:  Per [employer], MOB is Engineer, manufacturing.   Strengths:  Ability to meet basic needs , Home prepared for child , Pediatrician chosen   Psychotropic Medications:         Pediatrician:    [place] area  Pediatrician List:   [facility]      Pediatrician Fax Number:    Risk Factors/Current Problems:  None   Cognitive State:  Alert , Able to Concentrate , Linear Thinking , Insightful    Mood/Affect:  Comfortable , Happy , Interested    CSW Assessment: CSW met with MOB in room 117. When CSW arrived, MOB was resting in bed.  CSW  explained CSW's role and MOB was receptive to meeting with CSW. MOB was polite, easy to engage, and honest.   CSW asked about MOB's thoughts and feelings regarding labor and delivery and infant's admission into the NICU. MOB reported feeling better since infant's health is currently stable. MOB also shared feelings scared and nervous about infant's birthing experience and admission into the NICU.  CSW validated and normalized MOB's thoughts and feelings. CSW provided education regarding the baby blues period vs. perinatal mood disorders, discussed treatment and gave resources for mental health follow up if concerns arise.  CSW recommends self-evaluation during the postpartum time period using the New Mom Checklist from Postpartum Progress and encouraged MOB to contact a medical professional if symptoms are noted at any time. MOB did not present with any acute MH signs and denied having any MH hx. CSW assessed for safety and MOB denied SI, HI, and DV. MOB reported having the support of FOB, MOB's family, and FOB's parents.   MOB also reported having all essential items to care for infant including a new car seat and bassinet/crib. MOB denied barriers to visiting with infant after MOB is  discharged.  CSW reviewed NICU visitation policy and MOB was understanding.   CSW will continue to provide resources and supports to family while infant remains in NICU.   CSW Plan/Description:  Psychosocial Support and Ongoing Assessment of Needs, Perinatal Mood and Anxiety Disorder (PMADs) Education, Other Patient/Family Education, Other Information/Referral to [employer], MSW, [employer] Social Work [phone]   Dimple Nanas, LCSW 07/07/2018, 11:58 AM

## 2018-07-08 NOTE — Progress Notes (Signed)
POD #2 LTCS Doing better, pain improved, in NICU with baby Afeb, VSS Abd- soft, fundus firm, dressing and incision intact Continue routine care

## 2018-07-09 MED ORDER — OXYCODONE HCL 5 MG PO TABS: 5.0000 mg | ORAL_TABLET | ORAL | 0 refills | Status: DC | PRN

## 2018-07-09 MED ORDER — IBUPROFEN 800 MG PO TABS: 800.0000 mg | ORAL_TABLET | Freq: Three times a day (TID) | ORAL | 0 refills | Status: DC

## 2018-07-09 NOTE — Discharge Summary (Signed)
OB Discharge Summary     Patient Name: Adrienne Bishop DOB: 1980-02-10 MRN: [id number]  Date of admission: 07/06/2018 Delivering MD: Paula Compton   Date of discharge: 07/09/2018  Admitting diagnosis: pregnancy Intrauterine pregnancy: [redacted]w[redacted]d     Secondary diagnosis:  Active Problems:   Gestational hypertension without significant proteinuria during pregnancy in third trimester, antepartum   S/P primary low transverse C-section      Discharge diagnosis: Term Pregnancy Delivered and Gestational Hypertension                                                                                                Hospital course:  Induction of Labor With Cesarean Section  38 y.o. yo G1P1001 at [redacted]w[redacted]d was admitted to the hospital 07/06/2018 for induction of labor. Patient had a labor course significant for favorable cervix at term. The patient went for cesarean section due to fetal intolerance of labor, and delivered a Viable infant,07/06/2018  Membrane Rupture Time/Date: 3:29 AM ,07/06/2018   Details of operation can be found in separate operative Note.  Patient had an uncomplicated postpartum course. She is ambulating, tolerating a regular diet, passing flatus, and urinating well.  Patient is discharged home in stable condition on 07/09/18.                                    Physical exam  Vitals:   07/08/18 2017 07/09/18 0009 07/09/18 0020 07/09/18 0730  BP:  120/85 128/80 124/85  Pulse: 98 96 85 90  Resp: 18 18 16 16   Temp:  98.2 F (36.8 C) 98 F (36.7 C) 97.8 F (36.6 C)  TempSrc:  Oral Oral Oral  SpO2: 100% 100% 99% 100%  Weight:      Height:       General: alert Lochia: appropriate Uterine Fundus: firm Incision: Healing well with no significant drainage  Labs: Lab Results  Component Value Date   WBC 11.6 (H) 07/07/2018   HGB 9.5 (L) 07/07/2018   HCT 29.1 (L) 07/07/2018   MCV 86.4 07/07/2018   PLT 177 07/07/2018   CMP Latest Ref Rng & Units 07/06/2018  Glucose 70 - 99 mg/dL 85  BUN  6 - 20 mg/dL 10  Creatinine 0.44 - 1.00 mg/dL 0.55  Sodium 135 - 145 mmol/L 135  Potassium 3.5 - 5.1 mmol/L 4.1  Chloride 98 - 111 mmol/L 107  CO2 22 - 32 mmol/L 18(L)  Calcium 8.9 - 10.3 mg/dL 8.7(L)  Total Protein 6.5 - 8.1 g/dL 6.0(L)  Total Bilirubin 0.3 - 1.2 mg/dL 0.8  Alkaline Phos 38 - 126 U/L 162(H)  AST 15 - 41 U/L 17  ALT 0 - 44 U/L 20    Discharge instruction: per After Visit Summary and "Baby and Me Booklet".  After visit meds:  Allergies as of 07/09/2018      Reactions   Tree Extract Anaphylaxis   Tree nuts      Medication List    TAKE these medications   ibuprofen 800 MG tablet Commonly known as:  ADVIL Take 1 tablet (800 mg total) by mouth every 8 (eight) hours.   metFORMIN 500 MG 24 hr tablet Commonly known as:  GLUCOPHAGE-XR   oxyCODONE 5 MG immediate release tablet Commonly known as:  Oxy IR/ROXICODONE Take 1 tablet (5 mg total) by mouth every 4 (four) hours as needed for severe pain.       Diet: routine diet  Activity: Advance as tolerated. Pelvic rest for 6 weeks.   Outpatient follow up:2 weeks  Newborn Data: Live born female  Birth Weight: 7 lb 5.5 oz (3330 g) APGAR: 0, 1  Newborn Delivery   Birth date/time:  07/06/2018 17:09:00 Delivery type:  C-Section, Low Transverse Trial of labor:  Yes C-section categorization:  Primary     Baby Feeding: Breast Disposition:NICU   07/09/2018 Zenaida Nieceodd D Shenna Brissette, MD

## 2018-07-09 NOTE — Lactation Note (Signed)
This note was copied from a baby's chart. Lactation Consultation Note  Patient Name: Adrienne Bishop TELMR'A Date: 07/09/2018 Reason for consult: Follow-up assessment;Primapara;1st time breastfeeding;Term;Infant weight loss;NICU baby;Difficult latch;Other (Comment)(6% weight loss / mom using a #20 NS )  Baby is 35 hours old in NICU mom for D/C today.  Per mom has only used the DEBP x 1 in the last 24 hours with the #24 F with no milk expressed.  Used the manual pump with drops results and #27 F .  Per mom the baby has been latching with the #20 NS in NICU and working on latching.  LC reminded mom is the #20 NS starts feeling to snug to increase to the #24 NS is the baby  Can accommodate it.  Mom denies soreness and has been using the shells somewhat.  LC reviewed supply and demand and why the use of the DEBP  Consistently will enhance the  Let down quicker. For 24 hours 8-10 times in 24 hours for 15 -20 mins both breast at the same time.  Sore nipple and engorgement prevention and tx reviewed. Per mom aware of the storage of breast milk.  Per mom has DEBP Medela at home.  LC reminded mom to leave her DEBP kit so when she is visiting baby she can pump.  Per mom the baby may go home tomorrow .  LC recommended prior to D/C have the NICU RN call for [facility] for feeding assessment and  to place a  request for [place] O/Pappt..  Mom receptive.      Maternal Data Has patient been taught Hand Expression?: Yes  Feeding Feeding Type: Formula Nipple Type: Slow - flow  LATCH Score                   Interventions Interventions: Breast feeding basics reviewed  Lactation Tools Discussed/Used Tools: Shells;Pump;Flanges;Nipple Shields Nipple shield size: 20 Flange Size: 24;27 Shell Type: Inverted Breast pump type: Double-Electric Breast Pump;Manual WIC Program: No Pump Review: Milk Storage   Consult Status Consult Status: Follow-up Date: 07/10/18(baby in NICU ) Follow-up type:  In-patient    [place] 07/09/2018, 10:52 AM

## 2018-07-09 NOTE — Progress Notes (Signed)
POD #3 LTCS Doing well, pain is better, baby stable in NICU, wants to go home today Afeb, VSS Abd- soft, fundus firm, incision intact D/c home

## 2018-07-09 NOTE — Discharge Instructions (Signed)
As per discharge pamphlet °

## 2018-08-18 ENCOUNTER — Emergency Department (HOSPITAL_COMMUNITY): Payer: Commercial Managed Care - PPO

## 2018-08-18 ENCOUNTER — Emergency Department (HOSPITAL_COMMUNITY)
Admission: EM | Admit: 2018-08-18 | Discharge: 2018-08-18 | Disposition: A | Discharge status: Home / Self Care | Payer: Commercial Managed Care - PPO | Attending: Emergency Medicine | Admitting: Emergency Medicine

## 2018-08-18 ENCOUNTER — Other Ambulatory Visit: Payer: Self-pay

## 2018-08-18 ENCOUNTER — Encounter (HOSPITAL_COMMUNITY): Payer: Self-pay | Admitting: *Deleted

## 2018-08-18 DIAGNOSIS — O9989 Other specified diseases and conditions complicating pregnancy, childbirth and the puerperium: Secondary | ICD-10-CM | POA: Diagnosis not present

## 2018-08-18 DIAGNOSIS — Z7984 Long term (current) use of oral hypoglycemic drugs: Secondary | ICD-10-CM | POA: Diagnosis not present

## 2018-08-18 DIAGNOSIS — R1011 Right upper quadrant pain: Secondary | ICD-10-CM

## 2018-08-18 LAB — CBC WITH DIFFERENTIAL/PLATELET
Abs Immature Granulocytes: 0.03 10*3/uL (ref 0.00–0.07)
Basophils Absolute: 0 10*3/uL (ref 0.0–0.1)
Basophils Relative: 0 %
Eosinophils Absolute: 0.2 10*3/uL (ref 0.0–0.5)
Eosinophils Relative: 2 %
HCT: 38.1 % (ref 36.0–46.0)
Hemoglobin: 12.1 g/dL (ref 12.0–15.0)
Immature Granulocytes: 0 %
Lymphocytes Relative: 18 %
Lymphs Abs: 2.1 10*3/uL (ref 0.7–4.0)
MCH: 27.3 pg (ref 26.0–34.0)
MCHC: 31.8 g/dL (ref 30.0–36.0)
MCV: 85.8 fL (ref 80.0–100.0)
Monocytes Absolute: 0.6 10*3/uL (ref 0.1–1.0)
Monocytes Relative: 5 %
Neutro Abs: 8.6 10*3/uL — ABNORMAL HIGH (ref 1.7–7.7)
Neutrophils Relative %: 75 %
Platelets: 302 10*3/uL (ref 150–400)
RBC: 4.44 MIL/uL (ref 3.87–5.11)
RDW: 14.1 % (ref 11.5–15.5)
WBC: 11.7 10*3/uL — ABNORMAL HIGH (ref 4.0–10.5)
nRBC: 0 % (ref 0.0–0.2)

## 2018-08-18 LAB — COMPREHENSIVE METABOLIC PANEL
ALT: 47 U/L — ABNORMAL HIGH (ref 0–44)
AST: 25 U/L (ref 15–41)
Albumin: 3.4 g/dL — ABNORMAL LOW (ref 3.5–5.0)
Alkaline Phosphatase: 100 U/L (ref 38–126)
Anion gap: 7 (ref 5–15)
BUN: 11 mg/dL (ref 6–20)
CO2: 22 mmol/L (ref 22–32)
Calcium: 8.8 mg/dL — ABNORMAL LOW (ref 8.9–10.3)
Chloride: 110 mmol/L (ref 98–111)
Creatinine, Ser: 0.76 mg/dL (ref 0.44–1.00)
GFR calc Af Amer: >60 mL/min (ref >60)
GFR calc non Af Amer: >60 mL/min (ref >60)
Glucose, Bld: 109 mg/dL — ABNORMAL HIGH (ref 70–99)
Potassium: 4.2 mmol/L (ref 3.5–5.1)
Sodium: 139 mmol/L (ref 135–145)
Total Bilirubin: 0.3 mg/dL (ref 0.3–1.2)
Total Protein: 6.8 g/dL (ref 6.5–8.1)

## 2018-08-18 LAB — URINALYSIS, ROUTINE W REFLEX MICROSCOPIC
Bilirubin Urine: NEGATIVE
Glucose, UA: NEGATIVE mg/dL
Hgb urine dipstick: NEGATIVE
Ketones, ur: NEGATIVE mg/dL
Leukocytes,Ua: NEGATIVE
Nitrite: NEGATIVE
Protein, ur: NEGATIVE mg/dL
Specific Gravity, Urine: 1.016 (ref 1.005–1.030)
pH: 5 (ref 5.0–8.0)

## 2018-08-18 LAB — WET PREP, GENITAL
Clue Cells Wet Prep HPF POC: NONE SEEN
Sperm: NONE SEEN
Trich, Wet Prep: NONE SEEN
Yeast Wet Prep HPF POC: NONE SEEN

## 2018-08-18 LAB — I-STAT BETA HCG BLOOD, ED (MC, WL, AP ONLY): I-stat hCG, quantitative: <5 m[IU]/mL (ref <5)

## 2018-08-18 LAB — LIPASE, BLOOD: Lipase: 25 U/L (ref 11–51)

## 2018-08-18 MED ORDER — ONDANSETRON HCL 4 MG/2ML IJ SOLN
4.0000 mg | Freq: Once | INTRAMUSCULAR | Status: AC
  Administered 2018-08-18: 4 mg via INTRAVENOUS
  Filled 2018-08-18: qty 2

## 2018-08-18 MED ORDER — SODIUM CHLORIDE 0.9 % IV BOLUS
500.0000 mL | Freq: Once | INTRAVENOUS | Status: AC
  Administered 2018-08-18: 06:00:00 via INTRAVENOUS

## 2018-08-18 MED ORDER — KETOROLAC TROMETHAMINE 30 MG/ML IJ SOLN
30.0000 mg | Freq: Once | INTRAMUSCULAR | Status: AC
  Administered 2018-08-18: 30 mg via INTRAVENOUS
  Filled 2018-08-18: qty 1

## 2018-08-18 NOTE — ED Notes (Signed)
Pt verbalized understanding of d/c instructions and has no further questions, VSS, NAD. Pt to follow up with general surgery for gallbladder problem.

## 2018-08-18 NOTE — Discharge Instructions (Addendum)
You have been seen today for abdominal pain. Please read and follow all provided instructions.   1. Medications: tylenol/ibuprofen for pain, usual home medications 2. Treatment: rest, drink plenty of fluids 3. Follow Up: Please follow up with general surgery if symptoms persist. Please follow up with your primary doctor in 2-5 days for discussion of your diagnoses and further evaluation after today's visit; if you do not have a primary care doctor use the resource guide provided to find one; Please return to the ER for any new or worsening symptoms. Please obtain all of your results from medical records or have your doctors office obtain the results - share them with your doctor - you should be seen at your doctors office. Call today to arrange your follow up.   Take medications as prescribed. Please review all of the medicines and only take them if you do not have an allergy to them. Return to the emergency room for worsening condition or new concerning symptoms. Follow up with your regular doctor. If you don't have a regular doctor use one of the numbers below to establish a primary care doctor.  Please be aware that if you are taking birth control pills, taking other prescriptions, ESPECIALLY ANTIBIOTICS may make the birth control ineffective - if this is the case, either do not engage in sexual activity or use alternative methods of birth control such as condoms until you have finished the medicine and your family doctor says it is OK to restart them. If you are on a blood thinner such as COUMADIN, be aware that any other medicine that you take may cause the coumadin to either work too much, or not enough - you should have your coumadin level rechecked in next 7 days if this is the case.  ?  It is also a possibility that you have an allergic reaction to any of the medicines that you have been prescribed - Everybody reacts differently to medications and while MOST people have no trouble with most  medicines, you may have a reaction such as nausea, vomiting, rash, swelling, shortness of breath. If this is the case, please stop taking the medicine immediately and contact your physician.  ?  You should return to the ER if you develop severe or worsening symptoms.   Emergency Department Resource Guide 1) Find a Doctor and Pay Out of Pocket Although you won't have to find out who is covered by your insurance plan, it is a good idea to ask around and get recommendations. You will then need to call the office and see if the doctor you have chosen will accept you as a new patient and what types of options they offer for patients who are self-pay. Some doctors offer discounts or will set up payment plans for their patients who do not have insurance, but you will need to ask so you aren't surprised when you get to your appointment.  2) Contact Your Local Health Department Not all health departments have doctors that can see patients for sick visits, but many do, so it is worth a call to see if yours does. If you don't know where your local health department is, you can check in your phone book. The CDC also has a tool to help you locate your state's health department, and many state websites also have listings of all of their local health departments.  3) Find a Walk-in Clinic If your illness is not likely to be very severe or complicated, you may want  to try a walk in clinic. These are popping up all over the country in pharmacies, drugstores, and shopping centers. They're usually staffed by nurse practitioners or physician assistants that have been trained to treat common illnesses and complaints. They're usually fairly quick and inexpensive. However, if you have serious medical issues or chronic medical problems, these are probably not your best option.  No Primary Care Doctor: Call Health Connect at  [phone] - they can help you locate a primary care doctor that  accepts your insurance, provides  certain services, etc. Physician Referral Service- [phone]  Chronic Pain Problems: Organization         Address  Phone   Notes  [facility]  [phone] Patients need to be referred by their primary care doctor.   Medication Assistance: Organization         Address  Phone   Notes  [facility] [address] [place], [place] [address] [phone] --Must be a resident of [facility] -- Must have NO insurance coverage whatsoever (no Medicaid/ Medicare, etc.) -- The pt. MUST have a primary care doctor that directs their care regularly and follows them in the community   [facility]  [phone]   [employer]  [phone]    Agencies that provide inexpensive medical care: Organization         Address  Phone   Notes  [place]  [phone]   [facility]    [phone]   [facility] [place], [place] [address] [phone]   [place] [address] [place] [phone]   [facility]    [phone]   [facility]    [phone]   [place] [address], [place] Phone:  [phone], Fax:  [phone] Hours of Operation:  9 am - 6 pm, M-F.  Also accepts Medicaid/Medicare and self-pay.  [facility] for [place] [address], [address], [place] Phone: [phone], Fax: [phone]. Hours of Operation:  8:30 am - 5:30 pm, M-F.  Also accepts Medicaid and self-pay.  [facility] [place] [address] [place] Phone: [phone]   [place], [place], [place] [phone], Ext. [phone] Mondays & Thursdays: 7-9 AM.  First 15 patients are seen on a first come, first serve basis.    [place] Providers:  Organization         Address  Phone   Notes  [facility] [address], [place] [phone] Also accepts self-pay patients.  [facility] [address], [place]  [phone]   [place], [address], [place] [phone]   [facility] [address], [place] [phone]   Lucianne Lei [address], [place]   [phone] Only accepts [place] patients after they have their name applied to their card.   Self-Pay (no insurance) in [facility]:  Organization         Address  Phone   Notes  Sickle Cell Patients, [facility] [place] [phone]   [facility] [place] [phone]  [facility] [place]  [address], [place] [phone]   [facility]/Dr. Osei-Bonsu  [address] [place] or [address], [place] [phone] Phone number for both [place] and [employer] locations is the same.  [facility] [address], [place] [phone]   [facility] [address] [place] or [address] [phone]   [facility] [address], [place] [phone], phone; [phone], fax Sees patients 1st and 3rd Saturday of every month.  Must not qualify for public or private insurance (i.e. Medicaid, Medicare, [employer] Choice, Veterans' Benefits)  Household income should be no more than 200% of the poverty level The clinic cannot treat you if you are pregnant or think you are pregnant  Sexually transmitted diseases are not treated at the clinic.

## 2018-08-18 NOTE — ED Notes (Signed)
Pt transported to US in NAD.

## 2018-08-18 NOTE — ED Provider Notes (Signed)
Care assumed from [employer], [place].  Please see her full H&P.  In short,  Adrienne Bishop is a 38 y.o. female with a PMH of PCOS presents for constant right flank and RUQ abdominal pain onset 3 days ago. Patient had a c-section 6 weeks ago.  Physical Exam  BP 117/61   Pulse (!) 56   Temp (!) 97.4 F (36.3 C) (Oral)   Resp 20   Ht 5\' 3"  (1.6 m)   Wt 108.9 kg   SpO2 97%   BMI 42.51 kg/m   Physical Exam Vitals signs and nursing note reviewed.  Constitutional:      General: She is not in acute distress.    Appearance: She is well-developed. She is not diaphoretic.  HENT:     Head: Normocephalic and atraumatic.  Neck:     Musculoskeletal: Normal range of motion and neck supple.  Cardiovascular:     Rate and Rhythm: Normal rate and regular rhythm.     Heart sounds: Normal heart sounds. No murmur. No friction rub. No gallop.   Pulmonary:     Effort: Pulmonary effort is normal. No respiratory distress.     Breath sounds: Normal breath sounds. No wheezing or rales.  Abdominal:     General: Bowel sounds are normal. There is no distension.     Palpations: Abdomen is soft. Abdomen is not rigid. There is no mass.     Tenderness: There is no abdominal tenderness. There is no right CVA tenderness, left CVA tenderness, guarding or rebound.     Hernia: No hernia is present.     Comments: No abdominal pain upon palpation. C-section incision is clean, dry, and intact without erythema or tenderness. No suprapubic tenderness on exam.   Musculoskeletal: Normal range of motion.  Skin:    Findings: No rash.  Neurological:     Mental Status: She is alert and oriented to person, place, and time.     ED Course/Procedures   Clinical Course as of Aug 17 748  Thu Aug 18, 2018  0737 Leukocytosis noted at 11.7.   WBC(!): 11.7 [AH]  0738 WBCs noted on wet prep. No signs of yeast, trich, or clue cells.   WBC, Wet Prep HPF POC(!): MANY [AH]  0738 UA is unremarkable.   Urinalysis, Routine w  reflex microscopic [AH]  0750 Cholelithiasis without cholecystitis. No other definite abnormality seen in the right upper quadrant of the abdomen.    US Abdomen Limited RUQ [AH]    Clinical Course User Index [AH] Arville Lime, PA-C    Procedures  MDM  Patient had RUQ abdominal pain upon previous provider's exam. Previous provider performed pelvic exam and cervix without lesions or CMT with small amount of white discharge. Uterus was nontender. STI testing ordered. Abdominal ultrasound is pending. IVF, toradol, and zofran are being given. Will reevaluate and monitor symptoms.   Reassessed patient. Patient reports pain has completely resolved in the ER. Ultrasound reveals cholelithiasis without cholecystitis. UA is unremarkable. Suspect symptoms are likely due to symptomatic cholelithiasis. Provided referral to general surgery if symptoms persist. Patient is stable in no acute distress. Discussed findings with patient and discussed return precautions. Patient states she understands and agrees with plan.   Findings and plan of care discussed with supervising physician Dr. Sabra Heck.     Darlin Drop [place], [place] 08/18/18 [employer]    Noemi Chapel, MD 08/18/18 [phone]

## 2018-08-18 NOTE — ED Provider Notes (Addendum)
[facility] EMERGENCY DEPARTMENT Provider Note   CSN: [id number] Arrival date & time: 08/18/18  4854    History   Chief Complaint Chief Complaint  Patient presents with  . Flank Pain    HPI Adrienne Bishop is a 38 y.o. female.     HPI   Pt is a 38 y/o female with a h/o PCOS who presents to the ED today for eval of flank pain and abd pain. States that 3 days ago she developed right-sided flank pain.  Pain is now radiating to the right upper abdomen. Pain rated 6/10. Pain is constant. Pain is worse when laying on her right side. It is not made worse with movement. She has not noticed changes in pain with food. She has tried a heating pad and warm showers without relief of sxs. Reports associated nausea, but denies vomiting, diarrhea, constipation, dysuria, frequency, urgency, vaginal discharge or vaginal bleeding. Pt just had c-section 6 weeks ago. States there were no complications and she has been in her normal state of health since the surgery. Her LMP was sept 2019. She [place] not using any form of contraception.   Past Medical History:  Diagnosis Date  . Medical history non-contributory   . PCOS (polycystic ovarian syndrome)     Patient Active Problem List   Diagnosis Date Noted  . Gestational hypertension without significant proteinuria during pregnancy in third trimester, antepartum 07/06/2018  . S/P primary low transverse C-section 07/06/2018    Past Surgical History:  Procedure Laterality Date  . CESAREAN SECTION N/A 07/06/2018   Procedure: CESAREAN SECTION;  Surgeon: Paula Compton, MD;  Location: [place] [facility];  Service: Obstetrics;  Laterality: N/A;  . WISDOM TOOTH EXTRACTION       OB History    Gravida  1   Para  1   Term  1   Preterm      AB      Living  1     SAB      TAB      Ectopic      Multiple  0   Live Births  1            Home Medications    Prior to Admission medications   Medication Sig Start Date End Date  Taking? Authorizing Provider  ibuprofen (ADVIL) 800 MG tablet Take 1 tablet (800 mg total) by mouth every 8 (eight) hours. 07/09/18   Meisinger, Sherren Mocha, MD  metFORMIN (GLUCOPHAGE-XR) 500 MG 24 hr tablet  06/11/18   [provider]  oxyCODONE (OXY IR/ROXICODONE) 5 MG immediate release tablet Take 1 tablet (5 mg total) by mouth every 4 (four) hours as needed for severe pain. 07/09/18   Meisinger, Sherren Mocha, MD    Family History Family History  Problem Relation Age of Onset  . Cancer Maternal Grandfather   . Hypertension Paternal Grandmother   . Hypertension Paternal Grandfather   . Diabetes Paternal Grandfather   . Cancer Paternal Grandfather        bladder  . Stroke Paternal Grandfather     Social History Social History   Tobacco Use  . Smoking status: Never Smoker  . Smokeless tobacco: Never Used  Substance Use Topics  . Alcohol use: No  . Drug use: No     Allergies   Tree extract   Review of Systems Review of Systems  Constitutional: Negative for fever.  HENT: Negative for ear pain and sore throat.   Eyes: Negative for visual disturbance.  Respiratory: Negative for cough and shortness of breath.   Cardiovascular: Negative for chest pain.  Gastrointestinal: Positive for abdominal pain and nausea. Negative for constipation, diarrhea and vomiting.  Genitourinary: Positive for flank pain. Negative for dysuria, hematuria and urgency.  Musculoskeletal: Negative for back pain.  Skin: Negative for color change and rash.  Neurological: Negative for headaches.  All other systems reviewed and are negative.   Physical Exam Updated Vital Signs BP 125/73   Pulse (!) 56   Temp (!) 97.4 F (36.3 C) (Oral)   Resp 20   Ht 5\' 3"  (1.6 m)   Wt 108.9 kg   SpO2 99%   BMI 42.51 kg/m   Physical Exam Vitals signs and nursing note reviewed.  Constitutional:      General: She is not in acute distress.    Appearance: She is well-developed.  HENT:     Head: Normocephalic and  atraumatic.  Eyes:     Conjunctiva/sclera: Conjunctivae normal.  Neck:     Musculoskeletal: Neck supple.  Cardiovascular:     Rate and Rhythm: Normal rate and regular rhythm.     Pulses: Normal pulses.     Heart sounds: Normal heart sounds. No murmur.  Pulmonary:     Effort: Pulmonary effort is normal. No respiratory distress.     Breath sounds: Normal breath sounds. No wheezing, rhonchi or rales.  Abdominal:     Palpations: Abdomen is soft.     Tenderness: There is abdominal tenderness in the right upper quadrant. There is guarding. There is no right CVA tenderness, left CVA tenderness or rebound.  Genitourinary:    Comments: Exam performed by Karrie Meresortni S Arthuro Canelo,  exam chaperoned Date: 08/18/2018 Pelvic exam: normal external genitalia without evidence of trauma. VULVA: normal appearing vulva with no masses, tenderness or lesion. VAGINA: normal appearing vagina with normal color and discharge, no lesions. CERVIX: normal appearing cervix without lesions, cervical motion tenderness absent, cervical os closed with small amount of thick white discharge present.  Wet prep and DNA probe for chlamydia and GC obtained.   ADNEXA: normal adnexa in size, nontender and no masses UTERUS: uterus is normal size, shape, consistency and nontender.  Musculoskeletal:     Comments: TTP to the right thoracic paraspinous muscles which reproduces her pain. No rashes noted  Skin:    General: Skin is warm and dry.  Neurological:     Mental Status: She is alert.    ED Treatments / Results  Labs (all labs ordered are listed, but only abnormal results are displayed) Labs Reviewed  WET PREP, GENITAL - Abnormal; Notable for the following components:      Result Value   WBC, Wet Prep HPF POC MANY (*)    All other components within normal limits  CBC WITH DIFFERENTIAL/PLATELET - Abnormal; Notable for the following components:   WBC 11.7 (*)    Neutro Abs 8.6 (*)    All other components within normal limits   COMPREHENSIVE METABOLIC PANEL - Abnormal; Notable for the following components:   Glucose, Bld 109 (*)    Calcium 8.8 (*)    Albumin 3.4 (*)    ALT 47 (*)    All other components within normal limits  LIPASE, BLOOD  URINALYSIS, ROUTINE W REFLEX MICROSCOPIC  I-STAT BETA HCG BLOOD, ED ([facility], [facility], [facility] ONLY)  GC/CHLAMYDIA PROBE AMP ([facility]) NOT AT [facility]    EKG None  Radiology No results found.  Procedures Procedures (including critical care time)  Medications Ordered in  ED Medications  ketorolac (TORADOL) 30 MG/ML injection 30 mg (30 mg Intravenous Given 08/18/18 0609)  ondansetron (ZOFRAN) injection 4 mg (4 mg Intravenous Given 08/18/18 0607)  sodium chloride 0.9 % bolus 500 mL (0 mLs Intravenous Stopped 08/18/18 0646)     Initial Impression / Assessment and Plan / ED Course  I have reviewed the triage vital signs and the nursing notes.  Pertinent labs & imaging results that were available during my care of the patient were reviewed by me and considered in my medical decision making (see chart for details).   Final Clinical Impressions(s) / ED Diagnoses   Final diagnoses:  RUQ abdominal pain   38 year old female presenting the ER for evaluation of right flank and right upper abdominal pain ongoing for the last 3 days.  Associated with nausea but no vomiting.  No diarrhea constipation or urinary complaints.  Vital signs stable.  On eval, she has right upper quadrant tenderness with guarding.  Pelvic exam is benign.  We will obtain laboratory work and right upper quadrant ultrasound. CBC shows a mild leukocytosis, no anemia. CMP with mildly elevated at 47. Otherwise reassuring.  Lipase is negative Beta hcg negative UA pending Wet prep with WBC, no clue cells.  [facility] at shift change pending labs and RUQ US. If workup is negative and pt feels improved she may be appropriate for discharge. Would consider CT imaging to r/o nephrolithiasis  if there is gross hematuria on UA.    ED Discharge Orders    None       Karrie MeresCouture, Tyiana Hill S, PA-C 08/18/18 [id number]    Karrie MeresCouture, Hargun Spurling S, PA-C 08/18/18 [id number]    Dione BoozeGlick, David, MD 08/18/18 2255

## 2018-08-18 NOTE — ED Triage Notes (Signed)
The pt is c/o rt flank pain that radiated to her rt lower abd for 3 days  She delivered by c-section 6 weeks ago  She is not brfeast feeding  No urinary symptoms  No temp  No lmp since the delivery

## 2018-08-23 LAB — GC/CHLAMYDIA PROBE AMP (~~LOC~~) NOT AT ARMC
Chlamydia: NEGATIVE
Neisseria Gonorrhea: NEGATIVE

## 2019-08-17 IMAGING — US ULTRASOUND ABDOMEN LIMITED
1 series · 14 of 25 positions shown · non-contrast
Comparison: None.

CLINICAL DATA: Acute right upper quadrant abdominal pain.

EXAM:
ULTRASOUND ABDOMEN LIMITED RIGHT UPPER QUADRANT

[Series 1: ultrasound abdomen limited · 14 of 39 slices shown]
[im 1/39]
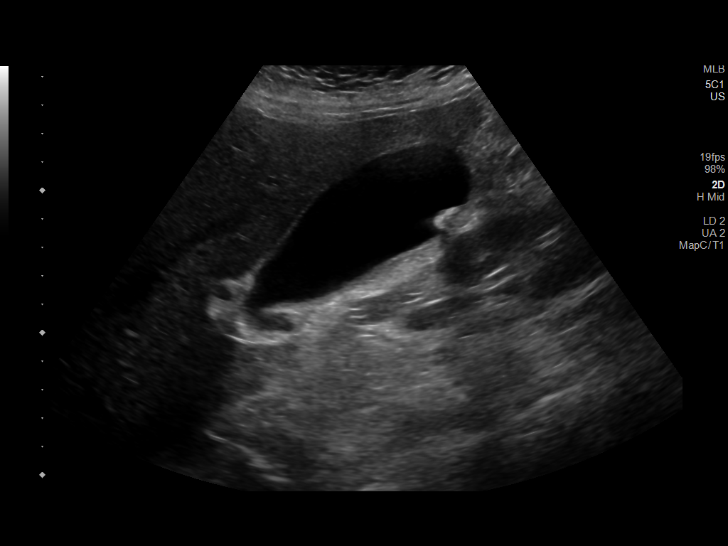
[im 4/39]
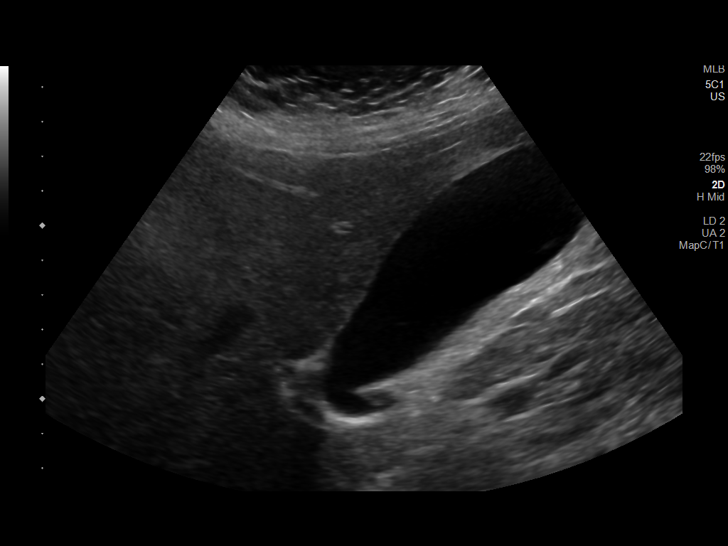
[im 7/39]
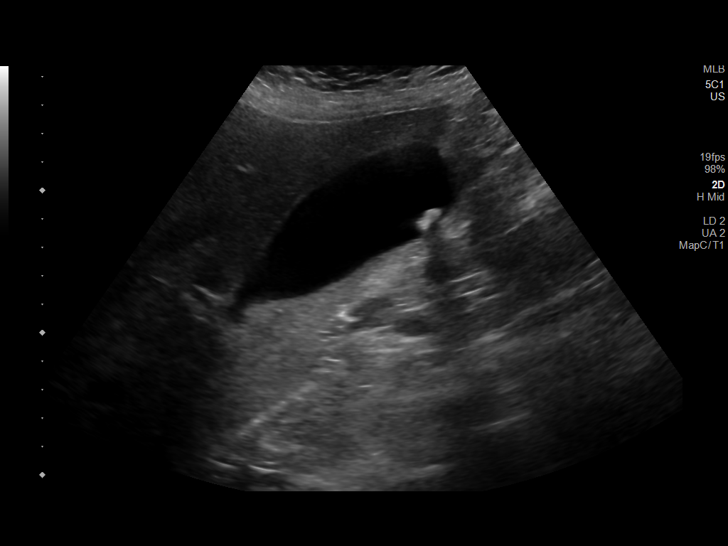
[im 10/39]
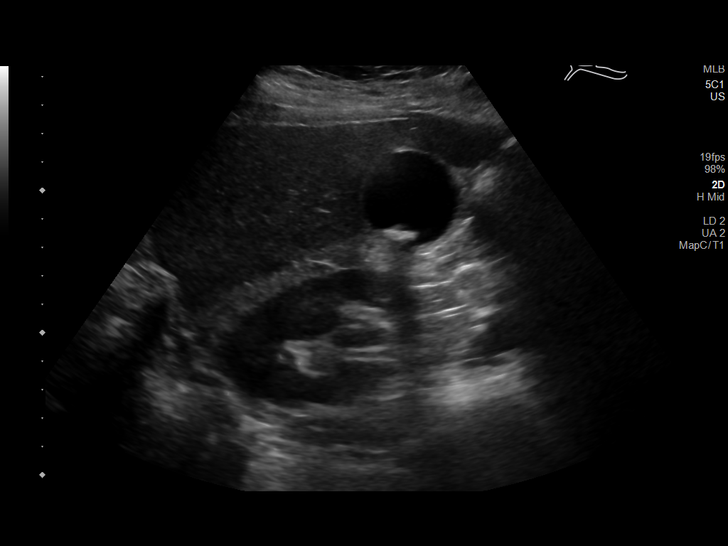
[im 13/39]
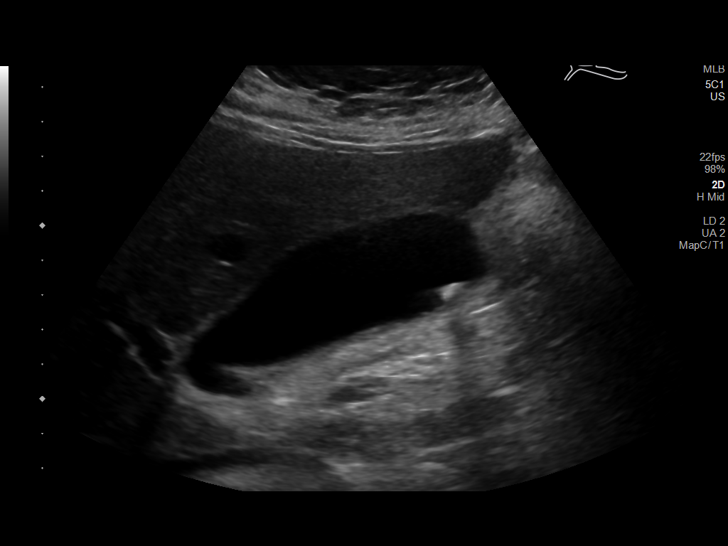
[im 15/39]
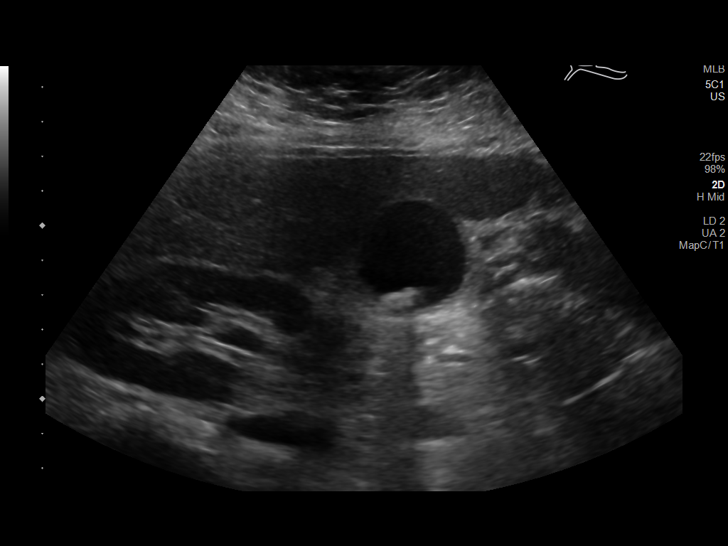
[im 18/39]
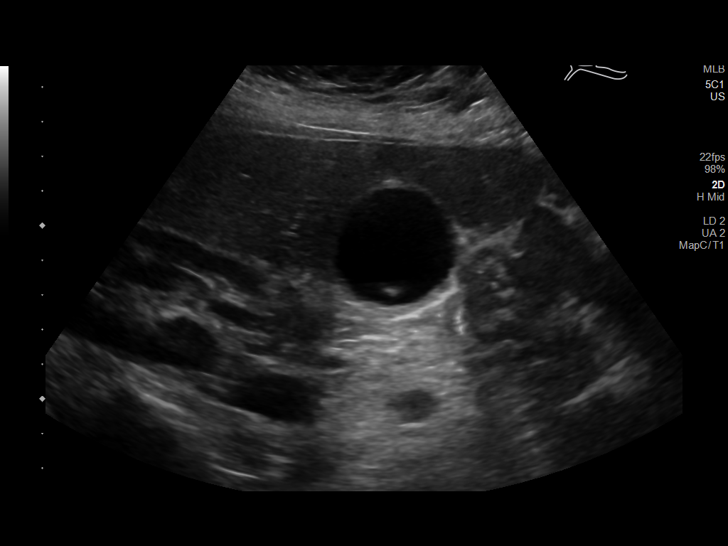
[im 21/39]
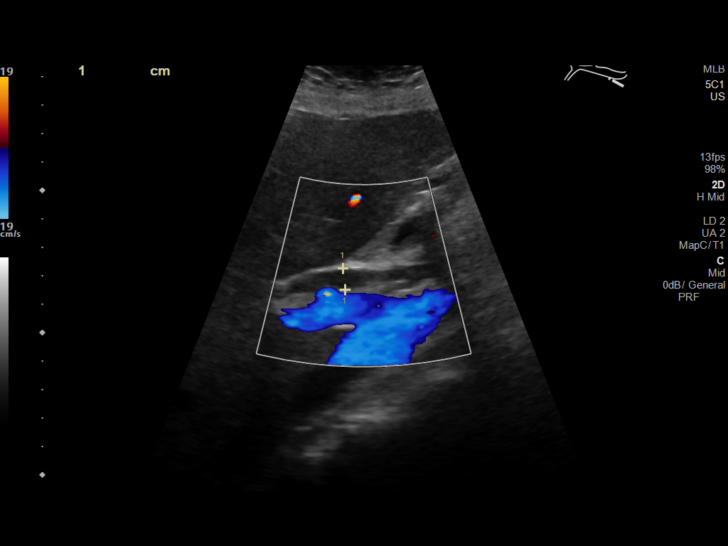
[im 24/39]
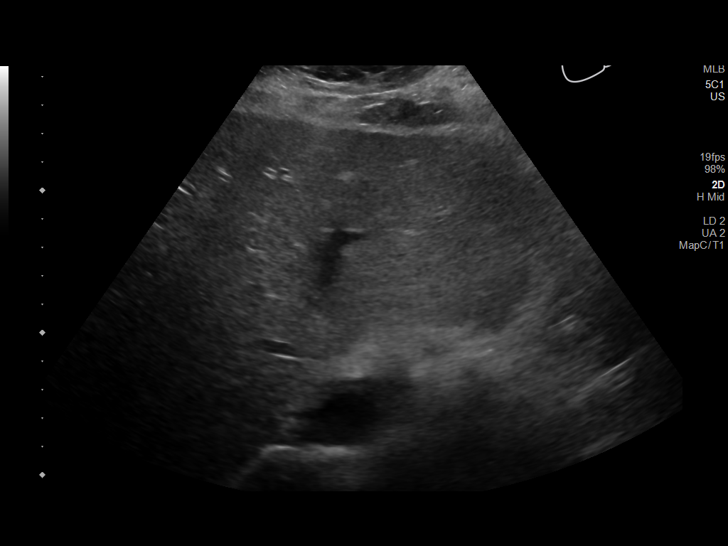
[im 26/39]
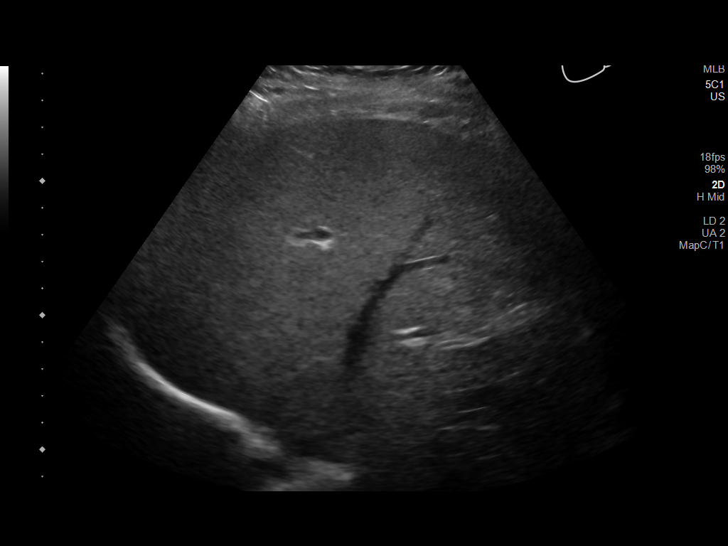
[im 29/39]
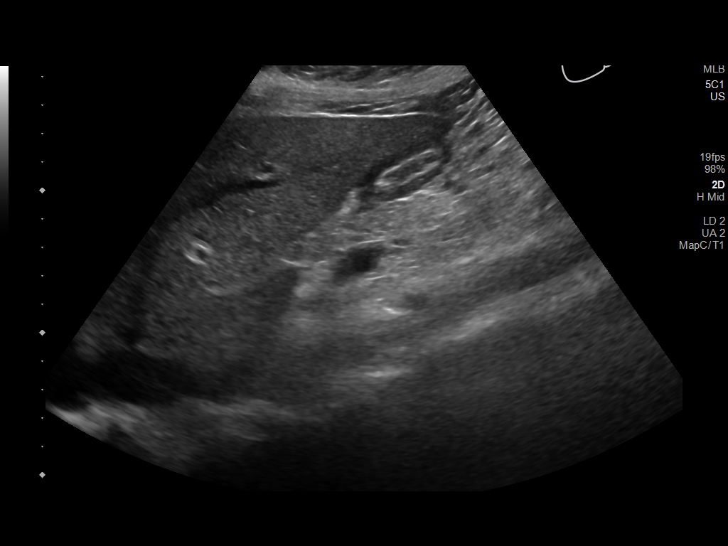
[im 32/39]
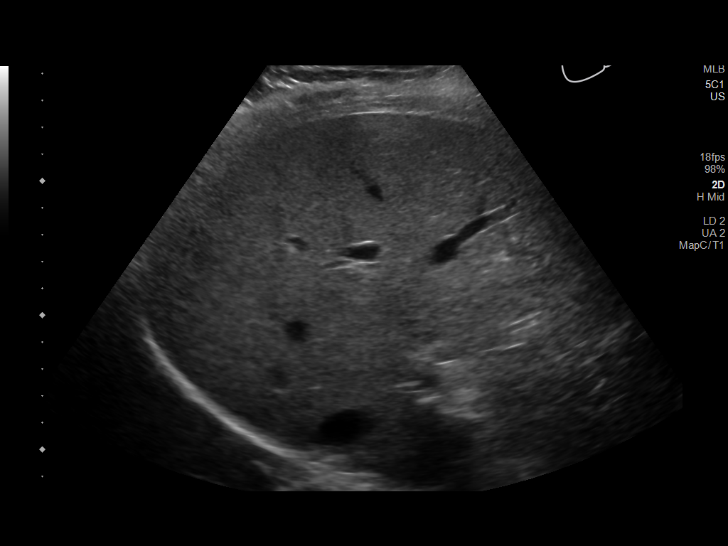
[im 35/39]
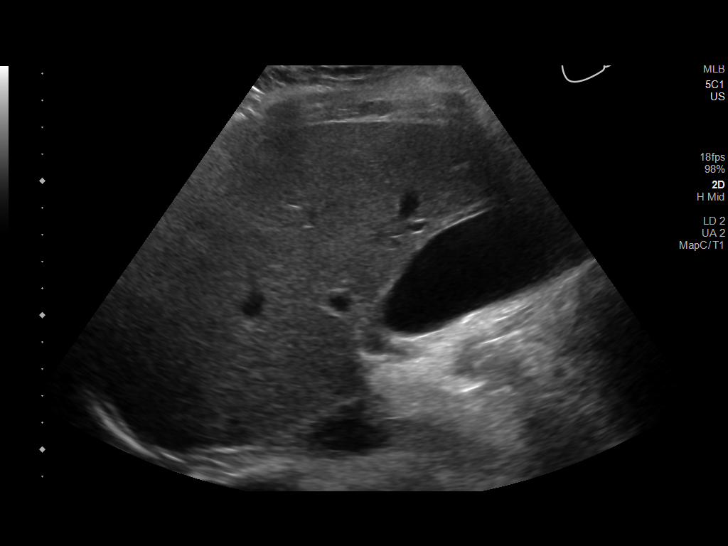
[im 39/39]
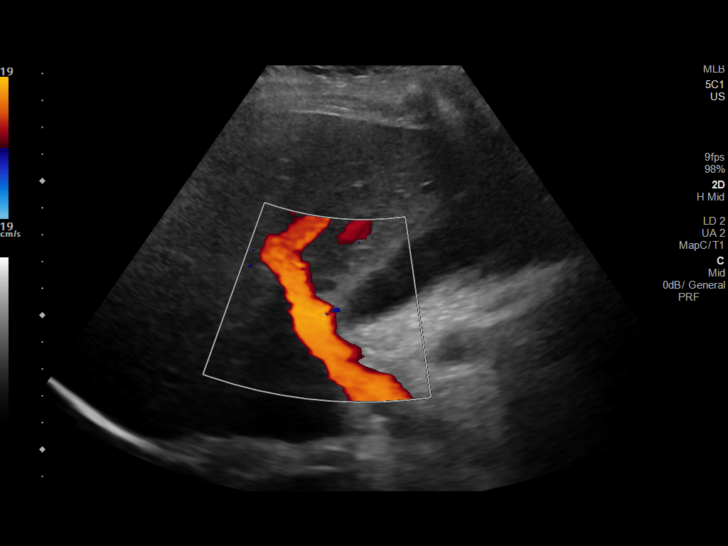

[14 of 25 positions shown; findings below may reference images not displayed]

FINDINGS: Gallbladder:

Mild cholelithiasis is noted with the largest calculus measuring 5
mm. No gallbladder wall thickening or pericholecystic fluid is
noted. No sonographic Murphy's sign is noted.

Common bile duct:

Diameter: 7 mm which is within normal limits.

Liver:

No focal lesion identified. Within normal limits in parenchymal
echogenicity. Portal vein is patent on color Doppler imaging with
normal direction of blood flow towards the liver.
IMPRESSION: Cholelithiasis without cholecystitis. No other definite abnormality
seen in the right upper quadrant of the abdomen.

## 2019-11-03 DIAGNOSIS — Z0000 Encounter for general adult medical examination without abnormal findings: Secondary | ICD-10-CM | POA: Diagnosis not present

## 2019-11-03 DIAGNOSIS — K8020 Calculus of gallbladder without cholecystitis without obstruction: Secondary | ICD-10-CM | POA: Diagnosis not present

## 2019-11-03 DIAGNOSIS — Z13220 Encounter for screening for lipoid disorders: Secondary | ICD-10-CM | POA: Diagnosis not present

## 2019-11-03 DIAGNOSIS — M25561 Pain in right knee: Secondary | ICD-10-CM | POA: Diagnosis not present

## 2020-06-10 DIAGNOSIS — Z20822 Contact with and (suspected) exposure to covid-19: Secondary | ICD-10-CM | POA: Diagnosis not present

## 2020-06-10 DIAGNOSIS — Z03818 Encounter for observation for suspected exposure to other biological agents ruled out: Secondary | ICD-10-CM | POA: Diagnosis not present

## 2020-09-17 DIAGNOSIS — H4711 Papilledema associated with increased intracranial pressure: Secondary | ICD-10-CM | POA: Diagnosis not present

## 2020-10-16 DIAGNOSIS — Z01419 Encounter for gynecological examination (general) (routine) without abnormal findings: Secondary | ICD-10-CM | POA: Diagnosis not present

## 2020-10-16 DIAGNOSIS — Z130 Encounter for screening for diseases of the blood and blood-forming organs and certain disorders involving the immune mechanism: Secondary | ICD-10-CM | POA: Diagnosis not present

## 2020-10-16 DIAGNOSIS — Z124 Encounter for screening for malignant neoplasm of cervix: Secondary | ICD-10-CM | POA: Diagnosis not present

## 2020-10-16 DIAGNOSIS — E669 Obesity, unspecified: Secondary | ICD-10-CM | POA: Diagnosis not present

## 2020-12-11 DIAGNOSIS — U071 COVID-19: Secondary | ICD-10-CM | POA: Diagnosis not present

## 2020-12-11 DIAGNOSIS — Z20822 Contact with and (suspected) exposure to covid-19: Secondary | ICD-10-CM | POA: Diagnosis not present

## 2021-01-15 DIAGNOSIS — M25552 Pain in left hip: Secondary | ICD-10-CM | POA: Diagnosis not present

## 2021-01-15 DIAGNOSIS — S76012A Strain of muscle, fascia and tendon of left hip, initial encounter: Secondary | ICD-10-CM | POA: Diagnosis not present

## 2021-02-05 DIAGNOSIS — M25552 Pain in left hip: Secondary | ICD-10-CM | POA: Diagnosis not present

## 2021-02-05 DIAGNOSIS — E559 Vitamin D deficiency, unspecified: Secondary | ICD-10-CM | POA: Diagnosis not present

## 2021-02-05 DIAGNOSIS — N393 Stress incontinence (female) (male): Secondary | ICD-10-CM | POA: Diagnosis not present

## 2021-02-05 DIAGNOSIS — Z13220 Encounter for screening for lipoid disorders: Secondary | ICD-10-CM | POA: Diagnosis not present

## 2021-02-05 DIAGNOSIS — Z0000 Encounter for general adult medical examination without abnormal findings: Secondary | ICD-10-CM | POA: Diagnosis not present

## 2021-02-05 LAB — BASIC METABOLIC PANEL
BUN: 11 (ref 4–21)
CO2: 28 — AB (ref 13–22)
Chloride: 105 (ref 99–108)
Creatinine: 0.6 (ref 0.5–1.1)
Glucose: 82
Potassium: 4.7 mEq/L (ref 3.5–5.1)
Sodium: 140 (ref 137–147)

## 2021-02-05 LAB — HEPATIC FUNCTION PANEL
ALT: 35 U/L (ref 7–35)
AST: 24 (ref 13–35)
Alkaline Phosphatase: 83 (ref 25–125)
Bilirubin, Total: 0.7

## 2021-02-05 LAB — CBC AND DIFFERENTIAL
HCT: 37 (ref 36–46)
Hemoglobin: 12.3 (ref 12.0–16.0)
Platelets: 236 10*3/uL (ref 150–400)
WBC: 8.6

## 2021-02-05 LAB — LIPID PANEL
Cholesterol: 153 (ref 0–200)
HDL: 44 (ref 35–70)
LDL Cholesterol: 91
Triglycerides: 99 (ref 40–160)

## 2021-02-05 LAB — COMPREHENSIVE METABOLIC PANEL
Albumin: 4.1 (ref 3.5–5.0)
Calcium: 9.1 (ref 8.7–10.7)

## 2021-02-05 LAB — CBC: RBC: 4.35 (ref 3.87–5.11)

## 2021-02-26 DIAGNOSIS — E559 Vitamin D deficiency, unspecified: Secondary | ICD-10-CM | POA: Diagnosis not present

## 2021-02-26 DIAGNOSIS — R748 Abnormal levels of other serum enzymes: Secondary | ICD-10-CM | POA: Diagnosis not present

## 2021-03-07 DIAGNOSIS — Z03818 Encounter for observation for suspected exposure to other biological agents ruled out: Secondary | ICD-10-CM | POA: Diagnosis not present

## 2021-03-07 DIAGNOSIS — Z20828 Contact with and (suspected) exposure to other viral communicable diseases: Secondary | ICD-10-CM | POA: Diagnosis not present

## 2021-03-07 DIAGNOSIS — U071 COVID-19: Secondary | ICD-10-CM | POA: Diagnosis not present

## 2021-03-21 DIAGNOSIS — U071 COVID-19: Secondary | ICD-10-CM | POA: Diagnosis not present

## 2021-03-21 DIAGNOSIS — Z20828 Contact with and (suspected) exposure to other viral communicable diseases: Secondary | ICD-10-CM | POA: Diagnosis not present

## 2021-03-25 DIAGNOSIS — Z0289 Encounter for other administrative examinations: Secondary | ICD-10-CM

## 2021-03-26 DIAGNOSIS — G932 Benign intracranial hypertension: Secondary | ICD-10-CM | POA: Diagnosis not present

## 2021-03-26 DIAGNOSIS — H3122 Choroidal dystrophy (central areolar) (generalized) (peripapillary): Secondary | ICD-10-CM | POA: Diagnosis not present

## 2021-04-22 ENCOUNTER — Ambulatory Visit (INDEPENDENT_AMBULATORY_CARE_PROVIDER_SITE_OTHER): Payer: BC Managed Care – PPO | Admitting: Family Medicine

## 2021-04-22 ENCOUNTER — Encounter (INDEPENDENT_AMBULATORY_CARE_PROVIDER_SITE_OTHER): Payer: Self-pay | Admitting: Family Medicine

## 2021-04-22 ENCOUNTER — Other Ambulatory Visit: Payer: Self-pay

## 2021-04-22 VITALS — BP 120/80 | HR 84 | Temp 97.7°F | Ht 64.0 in | Wt 245.0 lb

## 2021-04-22 DIAGNOSIS — Z9189 Other specified personal risk factors, not elsewhere classified: Secondary | ICD-10-CM

## 2021-04-22 DIAGNOSIS — R5383 Other fatigue: Secondary | ICD-10-CM

## 2021-04-22 DIAGNOSIS — E559 Vitamin D deficiency, unspecified: Secondary | ICD-10-CM

## 2021-04-22 DIAGNOSIS — R0602 Shortness of breath: Secondary | ICD-10-CM | POA: Diagnosis not present

## 2021-04-22 DIAGNOSIS — E669 Obesity, unspecified: Secondary | ICD-10-CM

## 2021-04-22 DIAGNOSIS — E282 Polycystic ovarian syndrome: Secondary | ICD-10-CM | POA: Diagnosis not present

## 2021-04-22 DIAGNOSIS — Z1331 Encounter for screening for depression: Secondary | ICD-10-CM | POA: Diagnosis not present

## 2021-04-22 DIAGNOSIS — I10 Essential (primary) hypertension: Secondary | ICD-10-CM | POA: Diagnosis not present

## 2021-04-22 DIAGNOSIS — Z6841 Body Mass Index (BMI) 40.0 and over, adult: Secondary | ICD-10-CM

## 2021-04-23 LAB — COMPREHENSIVE METABOLIC PANEL
ALT: 19 IU/L (ref 0–32)
AST: 16 IU/L (ref 0–40)
Albumin/Globulin Ratio: 1.5 (ref 1.2–2.2)
Albumin: 4.3 g/dL (ref 3.8–4.8)
Alkaline Phosphatase: 100 IU/L (ref 44–121)
BUN/Creatinine Ratio: 12 (ref 9–23)
BUN: 8 mg/dL (ref 6–20)
Bilirubin Total: 0.4 mg/dL (ref 0.0–1.2)
CO2: 22 mmol/L (ref 20–29)
Calcium: 9.1 mg/dL (ref 8.7–10.2)
Chloride: 105 mmol/L (ref 96–106)
Creatinine, Ser: 0.65 mg/dL (ref 0.57–1.00)
Globulin, Total: 2.8 g/dL (ref 1.5–4.5)
Glucose: 93 mg/dL (ref 70–99)
Potassium: 5 mmol/L (ref 3.5–5.2)
Sodium: 140 mmol/L (ref 134–144)
Total Protein: 7.1 g/dL (ref 6.0–8.5)
eGFR: 115 mL/min/{1.73_m2} (ref >59)

## 2021-04-23 LAB — HEMOGLOBIN A1C
Est. average glucose Bld gHb Est-mCnc: 97 mg/dL
Hgb A1c MFr Bld: 5 % (ref 4.8–5.6)

## 2021-04-23 LAB — TSH: TSH: 1.66 u[IU]/mL (ref 0.450–4.500)

## 2021-04-23 LAB — T3: T3, Total: 133 ng/dL (ref 71–180)

## 2021-04-23 LAB — FOLATE: Folate: 18.7 ng/mL (ref >3.0)

## 2021-04-23 LAB — T4, FREE: Free T4: 1.45 ng/dL (ref 0.82–1.77)

## 2021-04-23 LAB — VITAMIN B12: Vitamin B-12: 552 pg/mL (ref 232–1245)

## 2021-04-23 LAB — INSULIN, RANDOM: INSULIN: 19.5 u[IU]/mL (ref 2.6–24.9)

## 2021-04-23 LAB — VITAMIN D 25 HYDROXY (VIT D DEFICIENCY, FRACTURES): Vit D, 25-Hydroxy: 47.6 ng/mL (ref 30.0–100.0)

## 2021-04-24 ENCOUNTER — Encounter (INDEPENDENT_AMBULATORY_CARE_PROVIDER_SITE_OTHER): Payer: Self-pay | Admitting: Family Medicine

## 2021-04-24 NOTE — Telephone Encounter (Signed)
Dr.Ukleja 

## 2021-04-24 NOTE — Telephone Encounter (Signed)
Please advise 

## 2021-04-28 ENCOUNTER — Ambulatory Visit
Admission: RE | Admit: 2021-04-28 | Discharge: 2021-04-28 | Disposition: A | Discharge status: Home / Self Care | Payer: Commercial Managed Care - PPO | Source: Ambulatory Visit | Attending: Physician Assistant | Admitting: Physician Assistant

## 2021-04-28 ENCOUNTER — Other Ambulatory Visit: Payer: Self-pay | Admitting: Physician Assistant

## 2021-04-28 ENCOUNTER — Other Ambulatory Visit: Payer: Self-pay

## 2021-04-28 DIAGNOSIS — R053 Chronic cough: Secondary | ICD-10-CM | POA: Diagnosis not present

## 2021-04-28 NOTE — Progress Notes (Signed)
? ? ? ?Chief Complaint:  ? ?OBESITY ?Adrienne Bishop Samples (MR# [id number]) is a 41 y.o. female who presents for evaluation and treatment of obesity and related comorbidities. Current BMI is Body mass index is 42.05 kg/m?Marland Kitchen. Adrienne Bishop has been struggling with her weight for many years and has been unsuccessful in either losing weight, maintaining weight loss, or reaching her healthy weight goal. ? ?Adrienne Bishop was referred by Cindra PresumeVincent Costella. Engineering geologistDirector of Account Services for [employer]. She is eating out 6-7 times per week. She gave up fried food for lent. She drinks soda an paart of half of plain bagel (satisfied) lunch with [place] rice (1 1/2-2 cups) chicken and vegetables (full) with chips and water. Dinner consist of rice  1 cup, chicken 1/2 cup and vegetables 1/2 cup.  ? ?Adrienne Bishop is currently in the action stage of change and ready to dedicate time achieving and maintaining a healthier weight. Adrienne Bishop is interested in becoming our patient and working on intensive lifestyle modifications including (but not limited to) diet and exercise for weight loss. ? ?[name]'s habits were reviewed today and are as follows: Her family eats meals together, she thinks her family will eat healthier with her, her desired weight loss is 95 pounds, she has been heavy most of her life, she started gaining weight gradually since high school, her heaviest weight ever was 270 pounds, she is a picky eater and doesn't like to eat healthier foods, she has significant food cravings issues, she skips meals frequently, she is frequently drinking liquids with calories, she frequently makes poor food choices, she frequently eats larger portions than normal, she has binge eating behaviors, and she struggles with emotional eating. ? ?Depression Screen ?Ramya's Food and Mood (modified PHQ-9) score was 9. ? ? ?  04/22/2021  ?  7:21 AM  ?Depression screen PHQ 2/9  ?Decreased Interest 1  ?Down, Depressed, Hopeless 1  ?PHQ - 2 Score 2  ?Altered sleeping 0   ?Tired, decreased energy 2  ?Change in appetite 2  ?Feeling bad or failure about yourself  1  ?Trouble concentrating 2  ?Moving slowly or fidgety/restless 0  ?Suicidal thoughts 0  ?PHQ-9 Score 9  ?Difficult doing work/chores Somewhat difficult  ? ?Subjective:  ? ?1. Other fatigue ?Elizet's EKG-NSR was 76 bpm. Adrienne Bishop admits to daytime somnolence and admits to waking up still tired. Patient has a history of symptoms of morning fatigue. Adrienne Bishop generally gets 7 hours of sleep per night, and states that she has difficulty falling asleep and generally restful sleep. Snoring is present. Apneic episodes are not present. Epworth Sleepiness Score is 4.   ? ?2. SOBOE (shortness of breath on exertion) ?Adrienne Bishop notes increasing shortness of breath with exercising and seems to be worsening over time with weight gain. She notes getting out of breath sooner with activity than she used to. This has not gotten worse recently. Adrienne Bishop denies shortness of breath at rest or orthopnea.  ? ?3. Essential hypertension ?Adrienne Bishop was diagnosed in January 2023 for hypertension. She is currently on lisinopril 5 mg. Her blood pressure is very well controlled today 120/80. She denies chest pains, chest pressure, and headaches.  ? ?4. Vitamin D deficiency ?Adrienne Bishop is currently not on medications.  ? ?5. PCOS (polycystic ovarian syndrome) ?Adrienne Bishop was diagnosed thru her fertility doctor.  ? ?6. At risk for deficient intake of food ?The patient is at a higher than average risk of deficient intake of food due to not eating enough.  ? ?Assessment/Plan:  ? ?1. Other  fatigue ?We will check EKG, IC, and labs today. Sasha does feel that her weight is causing her energy to be lower than it should be. Fatigue may be related to obesity, depression or many other causes. Labs will be ordered, and in the meanwhile, Tamura will focus on self care including making healthy food choices, increasing physical activity and focusing on stress reduction.  ?- EKG 12-Lead ?- Vitamin  B12 ?- Folate ?- T3 ?- T4, free ?- TSH ? ?2. SOBOE (shortness of breath on exertion) ?Maudy does feel that she gets out of breath more easily that she used to when she exercises. Edlyn's shortness of breath appears to be obesity related and exercise induced. She has agreed to work on weight loss and gradually increase exercise to treat her exercise induced shortness of breath. Will continue to monitor closely.  ? ?3. Essential hypertension ?We will check CMP today; EKG. Makenzye is working on healthy weight loss and exercise to improve blood pressure control. We will watch for signs of hypotension as she continues her lifestyle modifications. ? ?- Comprehensive metabolic panel ? ?4. Vitamin D deficiency ?Low Vitamin D level contributes to fatigue and are associated with obesity, breast, and colon cancer. We will check Vitamin D today and Chaneka will follow-up for routine testing of Vitamin D, at least 2-3 times per year to avoid over-replacement. ? ?- VITAMIN D 25 Hydroxy (Vit-D Deficiency, Fractures) ? ?5. PCOS (polycystic ovarian syndrome) ?Intensive lifestyle modifications are first line treatment for this issue. We will check insulin level today. We discussed several lifestyle modifications today and she will continue to work on diet, exercise and weight loss efforts. Orders and follow up as documented in patient record. ? ?Counseling ?PCOS is a leading cause of menstrual irregularities and infertility. It is also associated with obesity, hirsutism (excessive hair growth on the face, chest, or back), and cardiovascular risk factors such as high cholesterol and insulin resistance. ?Insulin resistance appears to play a central role.  ?Women with PCOS have been shown to have impaired appetite-regulating hormones. ?Metformin is one medication that can improve metabolic parameters.  ?Women with polycystic ovary syndrome (PCOS) have an increased risk for cardiovascular disease (CVD) - European Journal of Preventive  Cardiology. ?  ?- Hemoglobin A1c ?- Insulin, random ? ?6. At risk for deficient intake of food ?Jennae was given approximately 15 minutes of deficient intake of food prevention counseling today. Romana is at risk for eating too few calories based on current food recall. She was encouraged to focus on meeting caloric and protein goals according to her recommended meal plan.  ? ?7. Depression screening ?Delphia had a positive depression screening. Depression is commonly associated with obesity and often results in emotional eating behaviors. We will monitor this closely and work on CBT to help improve the non-hunger eating patterns. Referral to Psychology may be required if no improvement is seen as she continues in our clinic.  ? ?8. Obesity with current BMI of 42.1 ?Marnae is currently in the action stage of change and her goal is to continue with weight loss efforts. I recommend Dayzha begin the structured treatment plan as follows: ? ?She has agreed to the Category 3 Plan. ? ?Exercise goals: No exercise has been prescribed at this time.  ? ?Behavioral modification strategies: increasing lean protein intake, decreasing eating out, and meal planning and cooking strategies. ? ?She was informed of the importance of frequent follow-up visits to maximize her success with intensive lifestyle modifications for her multiple health  conditions. She was informed we would discuss her lab results at her next visit unless there is a critical issue that needs to be addressed sooner. Adrienne Endo agreed to keep her next visit at the agreed upon time to discuss these results. ? ?Objective:  ? ?Blood pressure 120/80, pulse 84, temperature 97.7 ?F (36.5 ?C), height 5\' 4"  (1.626 m), weight 245 lb (111.1 kg), last menstrual period 04/20/2021, SpO2 98 %, unknown if currently breastfeeding. Body mass index is 42.05 kg/m?. ? ?EKG: Normal sinus rhythm, rate 76 bpm. ? ?Indirect Calorimeter completed today shows a VO2 of 259 and a REE of 1786.  Her  calculated basal metabolic rate is 04/22/2021 thus her basal metabolic rate is better than expected. ? ?General: Cooperative, alert, well developed, in no acute distress. ?HEENT: Conjunctivae and lids unremarkable. ?Cardiov

## 2021-05-06 ENCOUNTER — Ambulatory Visit (INDEPENDENT_AMBULATORY_CARE_PROVIDER_SITE_OTHER): Payer: BC Managed Care – PPO | Admitting: Family Medicine

## 2021-05-06 ENCOUNTER — Encounter (INDEPENDENT_AMBULATORY_CARE_PROVIDER_SITE_OTHER): Payer: Self-pay | Admitting: Family Medicine

## 2021-05-06 VITALS — BP 110/77 | HR 84 | Temp 98.0°F | Ht 64.0 in | Wt 237.0 lb

## 2021-05-06 DIAGNOSIS — I10 Essential (primary) hypertension: Secondary | ICD-10-CM | POA: Diagnosis not present

## 2021-05-06 DIAGNOSIS — E559 Vitamin D deficiency, unspecified: Secondary | ICD-10-CM

## 2021-05-06 DIAGNOSIS — E669 Obesity, unspecified: Secondary | ICD-10-CM | POA: Diagnosis not present

## 2021-05-06 DIAGNOSIS — Z6841 Body Mass Index (BMI) 40.0 and over, adult: Secondary | ICD-10-CM

## 2021-05-06 DIAGNOSIS — Z9189 Other specified personal risk factors, not elsewhere classified: Secondary | ICD-10-CM

## 2021-05-06 DIAGNOSIS — E8881 Metabolic syndrome: Secondary | ICD-10-CM

## 2021-05-06 MED ORDER — VITAMIN D3 50 MCG (2000 UT) PO CAPS: 2000.0000 [IU] | ORAL_CAPSULE | Freq: Every day | ORAL | Status: DC

## 2021-05-08 NOTE — Progress Notes (Signed)
? ? ? ?Chief Complaint:  ? ?OBESITY ?Adrienne Bishop is here to discuss her progress with her obesity treatment plan along with follow-up of her obesity related diagnoses. Adrienne Bishop is on the Category 3 Plan and states Adrienne Bishop is following her eating plan approximately 60% of the time. Adrienne Bishop states Adrienne Bishop is doing 0 minutes 0 times per week. ? ?Today's visit was #: 2 ?Starting weight: 245 lbs ?Starting date: 04/22/2021 ?Today's weight: 237 lbs ?Today's date: 05/06/2021 ?Total lbs lost to date: 8 ?Total lbs lost since last in-office visit: 8 ? ?Interim History: Patrick doing really well on lunch and dinner, but struggling with breakfast. Adrienne Bishop has been doing a protein shake for breakfast, premier protein shake. After lunch Adrienne Bishop often feels more hungry, Adrienne Bishop is often doing freezer/microwave meals. Adrienne Bishop is feeling a little hungry, especially in the afternoon. No upcoming plans. Adrienne Bishop has done [employer] mini cans occasionally for snack calories. Not always using all snack calories.  ? ?Subjective:  ? ?1. Vitamin D deficiency ?Adrienne Bishop's Vit D level is of 47.6. Adrienne Bishop is on OTC multivitamins.  ? ?2. Essential hypertension ?Adrienne Bishop's blood pressure is well controlled today. Adrienne Bishop is on lisinopril 5 mg daily.  ? ?3. Insulin resistance ?Adrienne Bishop's A1c is 5.0 and insulin 19.5. Adrienne Bishop notes some carb cravings but not much.  ? ?4. At risk for diabetes mellitus ?Adrienne Bishop is at higher than average risk for developing diabetes due to her obesity. ? ?Assessment/Plan:  ? ?1. Vitamin D deficiency ?Adrienne Bishop agrees to start Vitamin D 2,000 IU daily OTC. We will follow up on labs in 3 months.  ? ?- Cholecalciferol (VITAMIN D3) 50 MCG (2000 UT) capsule; Take 1 capsule (2,000 Units total) by mouth daily. ? ?2. Essential hypertension ?Adrienne Bishop will continue lisinopril, and if her blood pressure stays well controlled then we will stops medications at subsequent visits.  ? ?3. Insulin resistance ?We will repeat labs in 3 months.  ? ?4. At risk for diabetes mellitus ?Adrienne Bishop was given  approximately 15 minutes of diabetic education and counseling today. We discussed intensive lifestyle modifications today with an emphasis on weight loss as well as increasing exercise and decreasing simple carbohydrates in her diet. We also reviewed medication options with an emphasis on risk versus benefits of those discussed. ? ?Repetitive spaced learning was employed today to elicit superior memory formation and behavioral change. ? ?5. Obesity with current BMI of 40.7 ?Adrienne Bishop is currently in the action stage of change. As such, her goal is to continue with weight loss efforts. Adrienne Bishop has agreed to the Category 3 Plan.  ? ?Lunch options were given. ? ?Exercise goals: No exercise has been prescribed at this time. ? ?Behavioral modification strategies: increasing lean protein intake, meal planning and cooking strategies, keeping healthy foods in the home, and planning for success. ? ?Adrienne Bishop has agreed to follow-up with our clinic in 3 weeks. Adrienne Bishop was informed of the importance of frequent follow-up visits to maximize her success with intensive lifestyle modifications for her multiple health conditions.  ? ?Objective:  ? ?Blood pressure 110/77, pulse 84, temperature 98 ?F (36.7 ?C), height 5\' 4"  (1.626 m), weight 237 lb (107.5 kg), last menstrual period 04/20/2021, SpO2 97 %, unknown if currently breastfeeding. ?Body mass index is 40.68 kg/m?. ? ?General: Cooperative, alert, well developed, in no acute distress. ?HEENT: Conjunctivae and lids unremarkable. ?Cardiovascular: Regular rhythm.  ?Lungs: Normal work of breathing. ?Neurologic: No focal deficits.  ? ?Lab Results  ?Component Value Date  ? CREATININE 0.65 04/22/2021  ? BUN  8 04/22/2021  ? NA 140 04/22/2021  ? K 5.0 04/22/2021  ? CL 105 04/22/2021  ? CO2 22 04/22/2021  ? ?Lab Results  ?Component Value Date  ? ALT 19 04/22/2021  ? AST 16 04/22/2021  ? ALKPHOS 100 04/22/2021  ? BILITOT 0.4 04/22/2021  ? ?Lab Results  ?Component Value Date  ? HGBA1C 5.0 04/22/2021   ? ?Lab Results  ?Component Value Date  ? INSULIN 19.5 04/22/2021  ? ?Lab Results  ?Component Value Date  ? TSH 1.660 04/22/2021  ? ?Lab Results  ?Component Value Date  ? CHOL 153 02/05/2021  ? HDL 44 02/05/2021  ? [place] 91 02/05/2021  ? TRIG 99 02/05/2021  ? ?Lab Results  ?Component Value Date  ? VD25OH 47.6 04/22/2021  ? ?Lab Results  ?Component Value Date  ? WBC 8.6 02/05/2021  ? HGB 12.3 02/05/2021  ? HCT 37 02/05/2021  ? MCV 85.8 08/18/2018  ? PLT 236 02/05/2021  ? ?No results found for: IRON, TIBC, FERRITIN ? ?Attestation Statements:  ? ?Reviewed by clinician on day of visit: allergies, medications, problem list, medical history, surgical history, family history, social history, and previous encounter notes. ? ? ?I, Trixie Dredge, am acting as transcriptionist for Coralie Common, MD. ? ?I have reviewed the above documentation for accuracy and completeness, and I agree with the above. Coralie Common, MD ? ? ?

## 2021-05-22 ENCOUNTER — Ambulatory Visit (INDEPENDENT_AMBULATORY_CARE_PROVIDER_SITE_OTHER): Payer: BC Managed Care – PPO | Admitting: Adult Health

## 2021-05-28 ENCOUNTER — Encounter (INDEPENDENT_AMBULATORY_CARE_PROVIDER_SITE_OTHER): Payer: Self-pay | Admitting: Adult Health

## 2021-05-28 ENCOUNTER — Ambulatory Visit (INDEPENDENT_AMBULATORY_CARE_PROVIDER_SITE_OTHER): Payer: BC Managed Care – PPO | Admitting: Adult Health

## 2021-05-28 VITALS — BP 120/77 | HR 70 | Temp 97.5°F | Ht 64.0 in | Wt 235.0 lb

## 2021-05-28 DIAGNOSIS — Z6841 Body Mass Index (BMI) 40.0 and over, adult: Secondary | ICD-10-CM

## 2021-05-28 DIAGNOSIS — E669 Obesity, unspecified: Secondary | ICD-10-CM | POA: Diagnosis not present

## 2021-05-28 DIAGNOSIS — I10 Essential (primary) hypertension: Secondary | ICD-10-CM

## 2021-06-06 NOTE — Progress Notes (Signed)
? ? ? ?Chief Complaint:  ? ?OBESITY ?Adrienne Bishop is here to discuss her progress with her obesity treatment plan along with follow-up of her obesity related diagnoses. Adrienne Bishop is on the Category 3 Plan and states she is following her eating plan approximately 40% of the time. Adrienne Bishop states she is doing 0 minutes 0 times per week. ? ?Today's visit was #: 3 ?Starting weight: 245 lbs ?Starting date: 04/22/2021 ?Today's weight: 235 lbs ?Today's date: 05/28/2021 ?Total lbs lost to date: 10 lbs ?Total lbs lost since last in-office visit: 2 lbs ? ?Interim History:  ?Adrienne Bishop notes cravings for sugar and carbohydrates during menses.  ?She endorses being a "social eater" and will often eat out with co-workers at lunch.  Most often [place] food is consumed with colleagues. ? ?Of note-her weight October 2022 was 272 lbs. ?Current weight 235, current BMI 40.4 ? ?Subjective:  ? ?1. Essential hypertension ?Adrienne Bishop's blood pressure at office visit was 120/77- at goal.  ?She is currently on lisinopril 5 mg QD. ?She denies acute cardiac sx's. ? ?Assessment/Plan:  ? ?1. Essential hypertension ?Adrienne Bishop will continue lisinopril 5 mg every day.  ?She will decrease ACE inhibitors if blood pressure continues to trend down and/or with any sx's of hypotension. ?She is working on healthy weight loss and exercise to improve blood pressure control. We will watch for signs of hypotension as she continues her lifestyle modifications. ? ?2. Obesity with current BMI of 40.4 ?Adrienne Bishop is currently in the action stage of change. As such, her goal is to continue with weight loss efforts. She has agreed to the Category 3 Plan and keeping a food journal and adhering to recommended goals of 350-500 calories and 35 grams of protein at lunch.  ? ?Exercise goals: No exercise has been prescribed at this time. ? ?Behavioral modification strategies: increasing lean protein intake, decreasing simple carbohydrates, meal planning and cooking strategies, keeping healthy foods in the  home, and planning for success. ? ?Adrienne Bishop has agreed to follow-up with our clinic in 2 weeks. She was informed of the importance of frequent follow-up visits to maximize her success with intensive lifestyle modifications for her multiple health conditions.  ? ?Objective:  ? ?Blood pressure 120/77, pulse 70, temperature (!) 97.5 ?F (36.4 ?C), height 5\' 4"  (1.626 m), weight 235 lb (106.6 kg), SpO2 100 %, unknown if currently breastfeeding. ?Body mass index is 40.34 kg/m?. ? ?General: Cooperative, alert, well developed, in no acute distress. ?HEENT: Conjunctivae and lids unremarkable. ?Cardiovascular: Regular rhythm.  ?Lungs: Normal work of breathing. ?Neurologic: No focal deficits.  ? ?Lab Results  ?Component Value Date  ? CREATININE 0.65 04/22/2021  ? BUN 8 04/22/2021  ? NA 140 04/22/2021  ? K 5.0 04/22/2021  ? CL 105 04/22/2021  ? CO2 22 04/22/2021  ? ?Lab Results  ?Component Value Date  ? ALT 19 04/22/2021  ? AST 16 04/22/2021  ? ALKPHOS 100 04/22/2021  ? BILITOT 0.4 04/22/2021  ? ?Lab Results  ?Component Value Date  ? HGBA1C 5.0 04/22/2021  ? ?Lab Results  ?Component Value Date  ? INSULIN 19.5 04/22/2021  ? ?Lab Results  ?Component Value Date  ? TSH 1.660 04/22/2021  ? ?Lab Results  ?Component Value Date  ? CHOL 153 02/05/2021  ? HDL 44 02/05/2021  ? LDLCALC 91 02/05/2021  ? TRIG 99 02/05/2021  ? ?Lab Results  ?Component Value Date  ? VD25OH 47.6 04/22/2021  ? ?Lab Results  ?Component Value Date  ? WBC 8.6 02/05/2021  ? HGB 12.3  02/05/2021  ? HCT 37 02/05/2021  ? MCV 85.8 08/18/2018  ? PLT 236 02/05/2021  ? ?No results found for: IRON, TIBC, FERRITIN ? ?Attestation Statements:  ? ?Reviewed by clinician on day of visit: allergies, medications, problem list, medical history, surgical history, family history, social history, and previous encounter notes. ? ?Time spent on visit including pre-visit chart review and post-visit care and charting was 28 minutes.  ? ?I, Jackson Latino, RMA, am acting as Energy manager for  William Hamburger, NP. ? ?I have reviewed the above documentation for accuracy and completeness, and I agree with the above. -  Nahara Dona d. Katti Pelle, NP-C ?

## 2021-06-09 ENCOUNTER — Encounter (INDEPENDENT_AMBULATORY_CARE_PROVIDER_SITE_OTHER): Payer: Self-pay | Admitting: Adult Health

## 2021-06-09 ENCOUNTER — Ambulatory Visit (INDEPENDENT_AMBULATORY_CARE_PROVIDER_SITE_OTHER): Payer: BC Managed Care – PPO | Admitting: Adult Health

## 2021-06-09 VITALS — BP 114/74 | HR 74 | Temp 97.7°F | Ht 64.0 in | Wt 232.0 lb

## 2021-06-09 DIAGNOSIS — E8881 Metabolic syndrome: Secondary | ICD-10-CM

## 2021-06-09 DIAGNOSIS — I10 Essential (primary) hypertension: Secondary | ICD-10-CM

## 2021-06-09 DIAGNOSIS — E669 Obesity, unspecified: Secondary | ICD-10-CM | POA: Diagnosis not present

## 2021-06-09 DIAGNOSIS — Z6839 Body mass index (BMI) 39.0-39.9, adult: Secondary | ICD-10-CM

## 2021-06-09 DIAGNOSIS — E88819 Insulin resistance, unspecified: Secondary | ICD-10-CM

## 2021-06-09 DIAGNOSIS — E66813 Obesity, class 3: Secondary | ICD-10-CM

## 2021-06-18 NOTE — Progress Notes (Signed)
Chief Complaint:   OBESITY Adrienne Bishop is here to discuss her progress with her obesity treatment plan along with follow-up of her obesity related diagnoses. Adrienne Bishop is on the Category 3 Plan and states she is following her eating plan approximately 85% of the time. Adrienne Bishop states she is not currently exercising.   Today's visit was #: 4 Starting weight: 245 lbs Starting date: 04/02/2021 Today's weight: 232 lbs Today's date: 06/09/2021 Total lbs lost to date: 13 lbs Total lbs lost since last in-office visit: 3 lbs  Interim History:  Adrienne Bishop had a stable blood pressure this office visit.  She is down 3 lbs since her last visit.  Adrienne Bishop would like to lose 15 lbs ( approximately a  2 lb weight los per week) by early July.  Subjective:   1. Essential hypertension Adrienne Bishop denies dizziness with position changes or excessive fatigue.   Blood pressure stable at office visit.  Adrienne Bishop is currently taking Lisinopril 5 mg daily.  2. Insulin resistance Adrienne Bishop denies family history ort MTC or a personal history of pancreatitis.  Adrienne Bishop denies current polyphagia.  Assessment/Plan:   1. Essential hypertension Adrienne Bishop will continue Lisinopril 5 mg daily. Adrienne Bishop will also continue to monitor her blood pressure at home.  2. Insulin resistance We will check labs in the 2-3 months.  3. Obesity with current BMI of 39.9 Adrienne Bishop is currently in the action stage of change. As such, her goal is to continue with weight loss efforts. She has agreed to the Category 3 Plan.   Exercise goals: NEAT Activities  Behavioral modification strategies: increasing lean protein intake, decreasing simple carbohydrates, increasing vegetables, meal planning and cooking strategies, keeping healthy foods in the home, and planning for success.  Adrienne Bishop has agreed to follow-up with our clinic in 2 weeks. She was informed of the importance of frequent follow-up visits to maximize her success with intensive lifestyle modifications for her  multiple health conditions.   Objective:   Blood pressure 114/74, pulse 74, temperature 97.7 F (36.5 C), height 5\' 4"  (1.626 m), weight 232 lb (105.2 kg), SpO2 99 %, unknown if currently breastfeeding. Body mass index is 39.82 kg/m.  General: Cooperative, alert, well developed, in no acute distress. HEENT: Conjunctivae and lids unremarkable. Cardiovascular: Regular rhythm.  Lungs: Normal work of breathing. Neurologic: No focal deficits.   Lab Results  Component Value Date   CREATININE 0.65 04/22/2021   BUN 8 04/22/2021   NA 140 04/22/2021   K 5.0 04/22/2021   CL 105 04/22/2021   CO2 22 04/22/2021   Lab Results  Component Value Date   ALT 19 04/22/2021   AST 16 04/22/2021   ALKPHOS 100 04/22/2021   BILITOT 0.4 04/22/2021   Lab Results  Component Value Date   HGBA1C 5.0 04/22/2021   Lab Results  Component Value Date   INSULIN 19.5 04/22/2021   Lab Results  Component Value Date   TSH 1.660 04/22/2021   Lab Results  Component Value Date   CHOL 153 02/05/2021   HDL 44 02/05/2021   LDLCALC 91 02/05/2021   TRIG 99 02/05/2021   Lab Results  Component Value Date   VD25OH 47.6 04/22/2021   Lab Results  Component Value Date   WBC 8.6 02/05/2021   HGB 12.3 02/05/2021   HCT 37 02/05/2021   MCV 85.8 08/18/2018   PLT 236 02/05/2021   No results found for: IRON, TIBC, FERRITIN  Attestation Statements:   Reviewed by clinician on day of visit: allergies,  medications, problem list, medical history, surgical history, family history, social history, and previous encounter notes.  Time spent on visit including pre-visit chart review and post-visit care and charting was 28 minutes.   IPaulla Fore, CMA, am acting as transcriptionist for William Hamburger, NP-C   I have reviewed the above documentation for accuracy and completeness, and I agree with the above. -  Marisela Line d. Leveon Pelzer, NP-C

## 2021-06-23 DIAGNOSIS — E8881 Metabolic syndrome: Secondary | ICD-10-CM | POA: Insufficient documentation

## 2021-06-23 DIAGNOSIS — I10 Essential (primary) hypertension: Secondary | ICD-10-CM | POA: Insufficient documentation

## 2021-06-25 ENCOUNTER — Ambulatory Visit (INDEPENDENT_AMBULATORY_CARE_PROVIDER_SITE_OTHER): Payer: BC Managed Care – PPO | Admitting: Family Medicine

## 2021-07-09 ENCOUNTER — Ambulatory Visit (INDEPENDENT_AMBULATORY_CARE_PROVIDER_SITE_OTHER): Payer: BC Managed Care – PPO | Admitting: Adult Health

## 2021-09-02 DIAGNOSIS — R519 Headache, unspecified: Secondary | ICD-10-CM | POA: Diagnosis not present

## 2021-09-10 ENCOUNTER — Encounter (INDEPENDENT_AMBULATORY_CARE_PROVIDER_SITE_OTHER): Payer: Self-pay

## 2021-10-01 DIAGNOSIS — D225 Melanocytic nevi of trunk: Secondary | ICD-10-CM | POA: Diagnosis not present

## 2021-10-01 DIAGNOSIS — D229 Melanocytic nevi, unspecified: Secondary | ICD-10-CM | POA: Diagnosis not present

## 2021-10-01 DIAGNOSIS — L811 Chloasma: Secondary | ICD-10-CM | POA: Diagnosis not present

## 2021-11-10 DIAGNOSIS — E785 Hyperlipidemia, unspecified: Secondary | ICD-10-CM | POA: Diagnosis not present

## 2021-11-10 DIAGNOSIS — I10 Essential (primary) hypertension: Secondary | ICD-10-CM | POA: Diagnosis not present

## 2021-11-25 DIAGNOSIS — Z01419 Encounter for gynecological examination (general) (routine) without abnormal findings: Secondary | ICD-10-CM | POA: Diagnosis not present

## 2021-11-25 DIAGNOSIS — Z6841 Body Mass Index (BMI) 40.0 and over, adult: Secondary | ICD-10-CM | POA: Diagnosis not present

## 2021-11-25 DIAGNOSIS — Z1389 Encounter for screening for other disorder: Secondary | ICD-10-CM | POA: Diagnosis not present

## 2021-11-25 DIAGNOSIS — I10 Essential (primary) hypertension: Secondary | ICD-10-CM | POA: Diagnosis not present

## 2022-02-14 ENCOUNTER — Ambulatory Visit
Admission: EM | Admit: 2022-02-14 | Discharge: 2022-02-14 | Disposition: A | Discharge status: Home / Self Care | Payer: 59 | Attending: Internal Medicine | Admitting: Internal Medicine

## 2022-02-14 DIAGNOSIS — S29019A Strain of muscle and tendon of unspecified wall of thorax, initial encounter: Secondary | ICD-10-CM | POA: Diagnosis not present

## 2022-02-14 MED ORDER — CYCLOBENZAPRINE HCL 5 MG PO TABS: 5.0000 mg | ORAL_TABLET | Freq: Two times a day (BID) | ORAL | 0 refills | Status: AC | PRN

## 2022-02-14 NOTE — Discharge Instructions (Signed)
It appears that you have a muscle strain of your back/shoulder area.  I have prescribed a muscle relaxer to take as needed.  Please be advised that this can cause drowsiness so do not drive or drink alcohol while taking it.  Follow-up with PCP or urgent care if symptoms persist or worsen.

## 2022-02-14 NOTE — ED Triage Notes (Signed)
Pt presents to [facility] with co of 8/10 l shoulder blade pain since yesterday. Pain is constant and sometimes radiates to the front when she sneezes. Pt reports no abd pain nausea or vomiting. Pt is concerned for a pulled muscle.

## 2022-02-14 NOTE — ED Provider Notes (Signed)
[facility]    CSN: [id number] Arrival date & time: 02/14/22  9371      History   Chief Complaint No chief complaint on file.   HPI Adrienne Bishop is a 42 y.o. female.   Patient presents with posterior shoulder pain at the upper back that has been present since yesterday.  Patient denies any obvious injury but reports that he thinks it started after she reached up to grab something at work.  Any type of movement exacerbates the pain.  Denies numbness or tingling.  Has taken Tylenol with minimal improvement.  Denies history of chronic pain in that area.     Past Medical History:  Diagnosis Date   High blood pressure    Infertility, female    Medical history non-contributory    PCOS (polycystic ovarian syndrome)    Vitamin D deficiency     Patient Active Problem List   Diagnosis Date Noted   Essential hypertension 06/23/2021   Insulin resistance 06/23/2021   Class 3 severe obesity with serious comorbidity and body mass index (BMI) of 40.0 to 44.9 in adult [facility]) 06/23/2021   Gestational hypertension without significant proteinuria during pregnancy in third trimester, antepartum 07/06/2018   S/P primary low transverse C-section 07/06/2018    Past Surgical History:  Procedure Laterality Date   CESAREAN SECTION N/A 07/06/2018   Procedure: CESAREAN SECTION;  Surgeon: Huel Cote, MD;  Location: [facility] LD ORS;  Service: Obstetrics;  Laterality: N/A;   WISDOM TOOTH EXTRACTION      OB History     Gravida  1   Para  1   Term  1   Preterm      AB      Living  1      SAB      IAB      Ectopic      Multiple  0   Live Births  1            Home Medications    Prior to Admission medications   Medication Sig Start Date End Date Taking? Authorizing Provider  cyclobenzaprine (FLEXERIL) 5 MG tablet Take 1 tablet (5 mg total) by mouth 2 (two) times daily as needed for muscle spasms. 02/14/22  Yes Mikyle Sox, Rolly Salter E, FNP  lisinopril (ZESTRIL) 5 MG  tablet Take 5 mg by mouth daily.    [provider]    Family History Family History  Problem Relation Age of Onset   Depression Mother    Hypertension Father    Sleep apnea Father    Cancer Maternal Grandfather    Hypertension Paternal Grandmother    Hypertension Paternal Grandfather    Diabetes Paternal Grandfather    Cancer Paternal Grandfather        bladder   Stroke Paternal Grandfather     Social History Social History   Tobacco Use   Smoking status: Never   Smokeless tobacco: Never  Vaping Use   Vaping Use: Never used  Substance Use Topics   Alcohol use: No   Drug use: No     Allergies   Tree extract   Review of Systems Review of Systems Per HPI  Physical Exam Triage Vital Signs ED Triage Vitals  Enc Vitals Group     BP 02/14/22 0956 123/76     Pulse Rate 02/14/22 0956 (!) 59     Resp 02/14/22 0956 19     Temp 02/14/22 0956 97.9 F (36.6 C)     Temp  src --      SpO2 02/14/22 0956 98 %     Weight --      Height --      Head Circumference --      Peak Flow --      Pain Score 02/14/22 0959 8     Pain Loc --      Pain Edu? --      Excl. in [place]? --    No data found.  Updated Vital Signs BP 123/76   Pulse 79   Temp 97.9 F (36.6 C)   Resp 19   LMP 02/05/2022 (Exact Date)   SpO2 98%   Visual Acuity Right Eye Distance:   Left Eye Distance:   Bilateral Distance:    Right Eye Near:   Left Eye Near:    Bilateral Near:     Physical Exam Constitutional:      General: She is not in acute distress.    Appearance: Normal appearance. She is not toxic-appearing or diaphoretic.  HENT:     Head: Normocephalic and atraumatic.  Eyes:     Extraocular Movements: Extraocular movements intact.     Conjunctiva/sclera: Conjunctivae normal.  Pulmonary:     Effort: Pulmonary effort is normal.  Musculoskeletal:       Back:     Comments: Tenderness to palpation to left mid to upper thoracic back.  No direct spinal tenderness, crepitus,  step-off.  No abrasions, lacerations, discoloration, swelling noted.  Grip strength 5/5.  Neurovascular intact.  Neurological:     General: No focal deficit present.     Mental Status: She is alert and oriented to person, place, and time. Mental status is at baseline.  Psychiatric:        Mood and Affect: Mood normal.        Behavior: Behavior normal.        Thought Content: Thought content normal.        Judgment: Judgment normal.      UC Treatments / Results  Labs (all labs ordered are listed, but only abnormal results are displayed) Labs Reviewed - No data to display  EKG   Radiology No results found.  Procedures Procedures (including critical care time)  Medications Ordered in UC Medications - No data to display  Initial Impression / Assessment and Plan / UC Course  I have reviewed the triage vital signs and the nursing notes.  Pertinent labs & imaging results that were available during my care of the patient were reviewed by me and considered in my medical decision making (see chart for details).     Physical exam is consistent with muscle strain of thoracic back.  Will treat with muscle relaxer.  Advised patient that this can cause drowsiness and do not drive or drink alcohol with taking it.  Patient only takes lisinopril and denies any other chronic health problems so this medication should be safe.  Discussed supportive care including alternating ice and heat to the area.  Patient reports she has appointment with PCP in a few days so encouraged her to follow-up with them if symptoms persist or worsen.  Discussed return precautions.  Patient verbalized understanding and was agreeable with plan. Final Clinical Impressions(s) / UC Diagnoses   Final diagnoses:  Strain of muscle at thorax level     Discharge Instructions      It appears that you have a muscle strain of your back/shoulder area.  I have prescribed a muscle relaxer to take as  needed.  Please be advised  that this can cause drowsiness so do not drive or drink alcohol while taking it.  Follow-up with PCP or urgent care if symptoms persist or worsen.    ED Prescriptions     Medication Sig Dispense Auth. Provider   cyclobenzaprine (FLEXERIL) 5 MG tablet Take 1 tablet (5 mg total) by mouth 2 (two) times daily as needed for muscle spasms. 20 tablet [place], [place] E, [place]      I have reviewed the PDMP during this encounter.   Teodora Medici, [place] 02/14/22 1051

## 2022-04-27 IMAGING — CR DG CHEST 2V
2 series · 2 of 2 positions shown · non-contrast
Comparison: Prior chest radiographs 03/28/2015 and earlier.

CLINICAL DATA: Chronic cough. Additional history provided by
technologist: Patient reports cough for 1 month.

EXAM:
CHEST - 2 VIEW

[w chest pa]
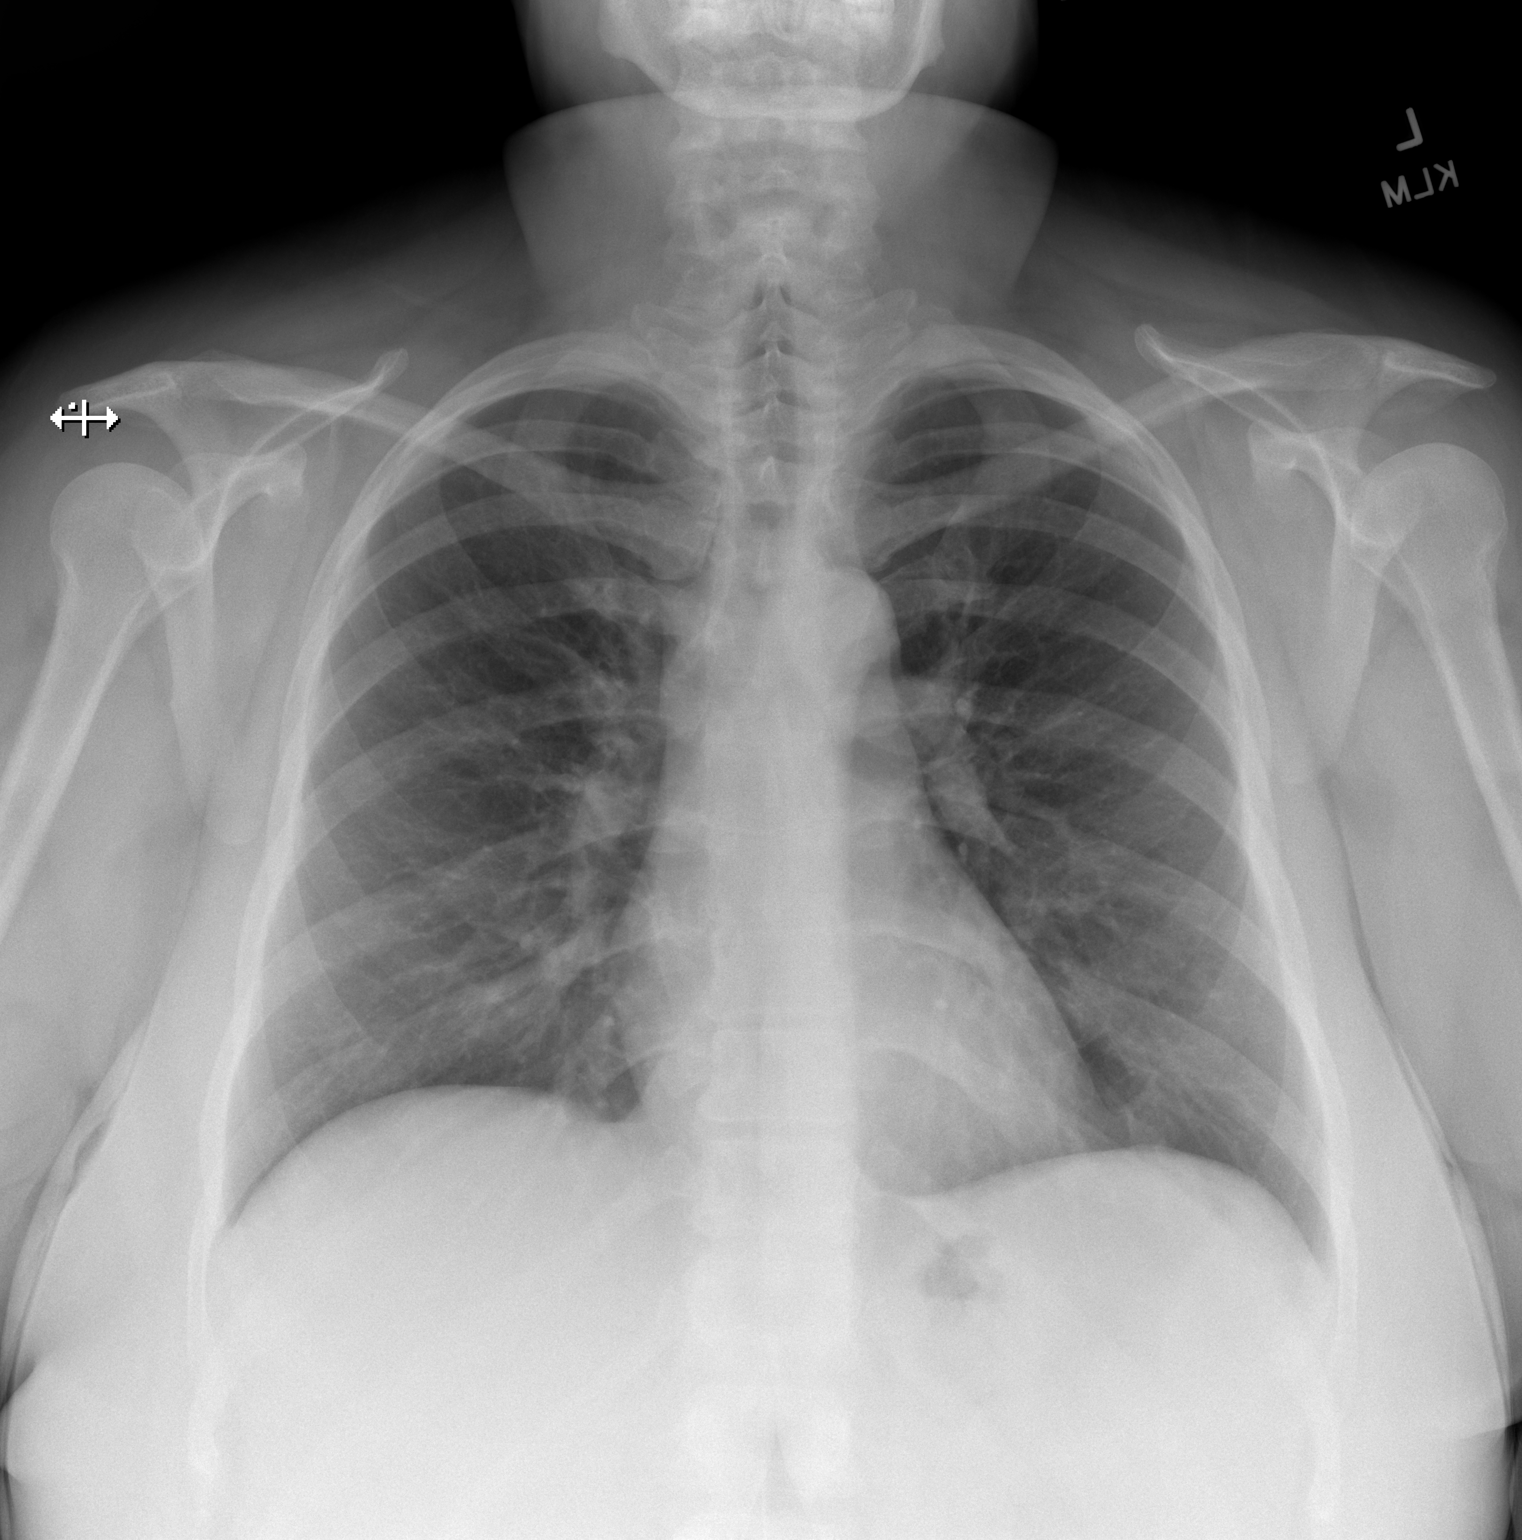

[w chest lat]
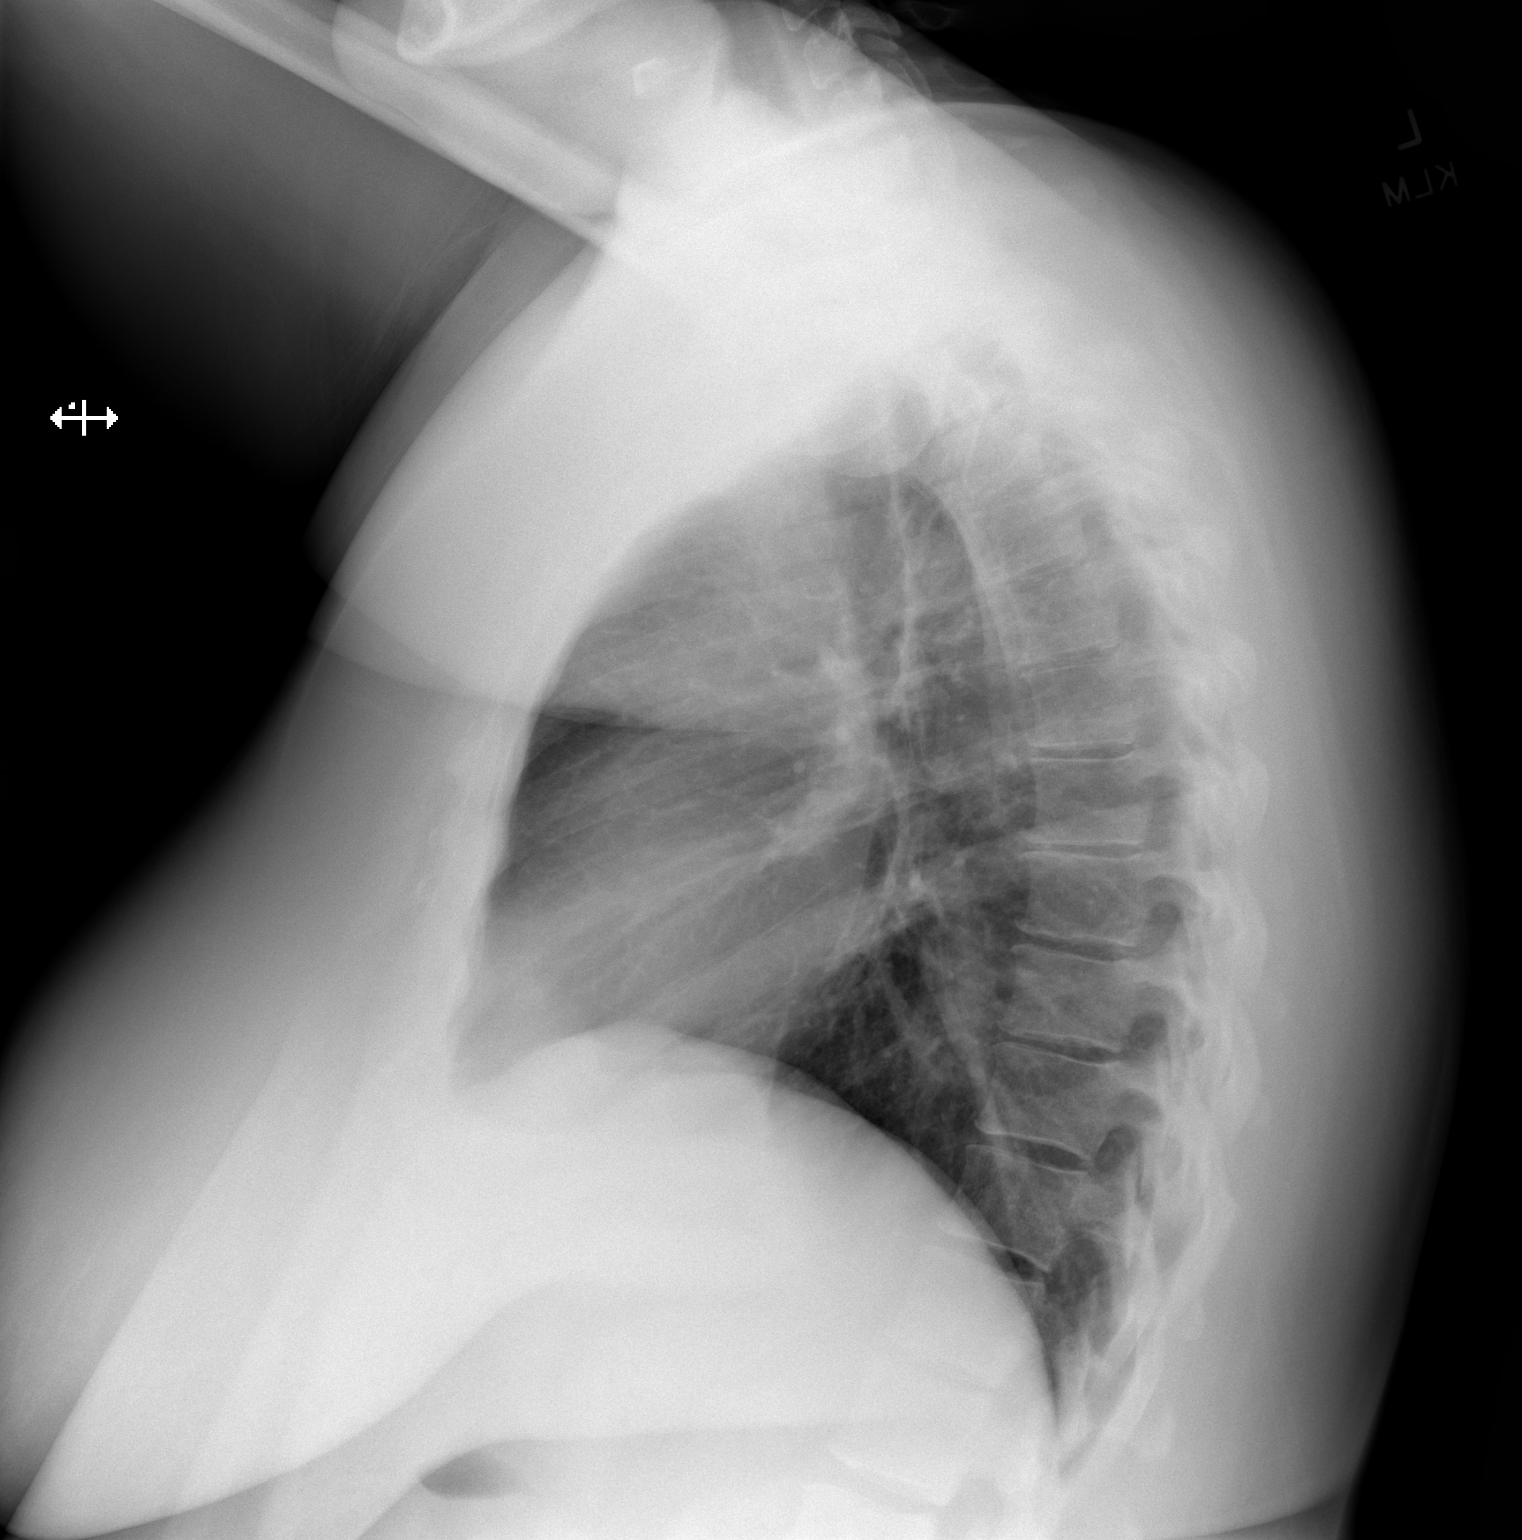

[2 of 2 positions shown; findings below may reference images not displayed]

FINDINGS: Heart size within normal limits. No appreciable airspace
consolidation. No evidence of pleural effusion or pneumothorax. No
acute bony abnormality identified.
IMPRESSION: No evidence of active cardiopulmonary disease.

## 2022-05-12 DIAGNOSIS — L738 Other specified follicular disorders: Secondary | ICD-10-CM | POA: Diagnosis not present

## 2022-05-12 DIAGNOSIS — D2239 Melanocytic nevi of other parts of face: Secondary | ICD-10-CM | POA: Diagnosis not present

## 2022-07-01 DIAGNOSIS — M7660 Achilles tendinitis, unspecified leg: Secondary | ICD-10-CM | POA: Diagnosis not present

## 2022-07-01 DIAGNOSIS — E282 Polycystic ovarian syndrome: Secondary | ICD-10-CM | POA: Diagnosis not present

## 2022-11-27 DIAGNOSIS — Z1231 Encounter for screening mammogram for malignant neoplasm of breast: Secondary | ICD-10-CM | POA: Diagnosis not present

## 2022-11-27 DIAGNOSIS — Z01419 Encounter for gynecological examination (general) (routine) without abnormal findings: Secondary | ICD-10-CM | POA: Diagnosis not present

## 2022-11-27 DIAGNOSIS — N644 Mastodynia: Secondary | ICD-10-CM | POA: Diagnosis not present

## 2022-11-27 DIAGNOSIS — Z130 Encounter for screening for diseases of the blood and blood-forming organs and certain disorders involving the immune mechanism: Secondary | ICD-10-CM | POA: Diagnosis not present

## 2022-12-03 ENCOUNTER — Other Ambulatory Visit: Payer: Self-pay | Admitting: Obstetrics and Gynecology

## 2022-12-03 ENCOUNTER — Encounter: Payer: Self-pay | Admitting: Obstetrics and Gynecology

## 2022-12-03 DIAGNOSIS — R928 Other abnormal and inconclusive findings on diagnostic imaging of breast: Secondary | ICD-10-CM

## 2022-12-05 ENCOUNTER — Ambulatory Visit: Payer: 59

## 2022-12-05 ENCOUNTER — Ambulatory Visit
Admission: RE | Admit: 2022-12-05 | Discharge: 2022-12-05 | Disposition: A | Discharge status: Home / Self Care | Payer: BC Managed Care – PPO | Source: Ambulatory Visit | Attending: Obstetrics and Gynecology

## 2022-12-05 DIAGNOSIS — R928 Other abnormal and inconclusive findings on diagnostic imaging of breast: Secondary | ICD-10-CM

## 2023-01-28 DIAGNOSIS — E559 Vitamin D deficiency, unspecified: Secondary | ICD-10-CM | POA: Diagnosis not present

## 2023-01-28 DIAGNOSIS — I10 Essential (primary) hypertension: Secondary | ICD-10-CM | POA: Diagnosis not present

## 2023-01-28 DIAGNOSIS — Z0000 Encounter for general adult medical examination without abnormal findings: Secondary | ICD-10-CM | POA: Diagnosis not present

## 2023-01-28 DIAGNOSIS — E7800 Pure hypercholesterolemia, unspecified: Secondary | ICD-10-CM | POA: Diagnosis not present

## 2023-10-22 DIAGNOSIS — H9202 Otalgia, left ear: Secondary | ICD-10-CM | POA: Diagnosis not present

## 2023-10-22 DIAGNOSIS — H6122 Impacted cerumen, left ear: Secondary | ICD-10-CM | POA: Diagnosis not present

## 2023-12-15 DIAGNOSIS — Z1231 Encounter for screening mammogram for malignant neoplasm of breast: Secondary | ICD-10-CM | POA: Diagnosis not present

## 2023-12-15 DIAGNOSIS — Z130 Encounter for screening for diseases of the blood and blood-forming organs and certain disorders involving the immune mechanism: Secondary | ICD-10-CM | POA: Diagnosis not present

## 2023-12-15 DIAGNOSIS — Z01419 Encounter for gynecological examination (general) (routine) without abnormal findings: Secondary | ICD-10-CM | POA: Diagnosis not present
